# Patient Record
Sex: Female | Born: 1935 | Race: White | Hispanic: No | Marital: Married | Smoking: Former smoker
Health system: Southern US, Community
[De-identification: ages and names within clinical notes are randomized; demographics above are authoritative.]

## PROBLEM LIST (undated history)

## (undated) DIAGNOSIS — E119 Type 2 diabetes mellitus without complications: Secondary | ICD-10-CM

## (undated) DIAGNOSIS — H409 Unspecified glaucoma: Secondary | ICD-10-CM

## (undated) DIAGNOSIS — M1990 Unspecified osteoarthritis, unspecified site: Secondary | ICD-10-CM

## (undated) HISTORY — PX: THYROIDECTOMY: SHX17

---

## 1998-06-21 ENCOUNTER — Other Ambulatory Visit: Admission: RE | Admit: 1998-06-21 | Discharge: 1998-06-21 | Payer: Self-pay | Admitting: Family Medicine

## 1998-08-11 ENCOUNTER — Ambulatory Visit (HOSPITAL_COMMUNITY): Admission: RE | Admit: 1998-08-11 | Discharge: 1998-08-11 | Payer: Self-pay | Admitting: Family Medicine

## 1998-08-11 ENCOUNTER — Encounter: Payer: Self-pay | Admitting: Family Medicine

## 1999-06-20 ENCOUNTER — Encounter: Admission: RE | Admit: 1999-06-20 | Discharge: 1999-09-18 | Payer: Self-pay | Admitting: Family Medicine

## 2001-07-09 ENCOUNTER — Ambulatory Visit (HOSPITAL_COMMUNITY): Admission: RE | Admit: 2001-07-09 | Discharge: 2001-07-09 | Payer: Self-pay | Admitting: Family Medicine

## 2001-07-09 ENCOUNTER — Encounter: Payer: Self-pay | Admitting: Family Medicine

## 2001-09-11 ENCOUNTER — Other Ambulatory Visit: Admission: RE | Admit: 2001-09-11 | Discharge: 2001-09-11 | Payer: Self-pay | Admitting: Family Medicine

## 2001-09-24 ENCOUNTER — Encounter: Admission: RE | Admit: 2001-09-24 | Discharge: 2001-09-24 | Payer: Self-pay | Admitting: Family Medicine

## 2001-09-24 ENCOUNTER — Encounter: Payer: Self-pay | Admitting: Family Medicine

## 2004-01-01 ENCOUNTER — Ambulatory Visit (HOSPITAL_COMMUNITY): Admission: RE | Admit: 2004-01-01 | Discharge: 2004-01-01 | Payer: Self-pay | Admitting: Family Medicine

## 2004-06-14 ENCOUNTER — Other Ambulatory Visit: Admission: RE | Admit: 2004-06-14 | Discharge: 2004-06-14 | Payer: Self-pay | Admitting: Family Medicine

## 2007-01-14 ENCOUNTER — Other Ambulatory Visit: Admission: RE | Admit: 2007-01-14 | Discharge: 2007-01-14 | Payer: Self-pay | Admitting: Family Medicine

## 2008-01-27 ENCOUNTER — Encounter: Admission: RE | Admit: 2008-01-27 | Discharge: 2008-01-27 | Payer: Self-pay | Admitting: Family Medicine

## 2009-03-25 ENCOUNTER — Encounter: Admission: RE | Admit: 2009-03-25 | Discharge: 2009-05-26 | Payer: Self-pay | Admitting: Family Medicine

## 2010-08-21 ENCOUNTER — Encounter: Payer: Self-pay | Admitting: Family Medicine

## 2012-08-30 ENCOUNTER — Observation Stay (HOSPITAL_COMMUNITY)
Admission: EM | Admit: 2012-08-30 | Discharge: 2012-09-03 | Disposition: A | Discharge status: Skilled Nursing Facility | Payer: Medicare Other | Attending: Internal Medicine | Admitting: Internal Medicine

## 2012-08-30 ENCOUNTER — Emergency Department (HOSPITAL_COMMUNITY): Payer: Medicare Other

## 2012-08-30 ENCOUNTER — Encounter (HOSPITAL_COMMUNITY): Payer: Self-pay | Admitting: Emergency Medicine

## 2012-08-30 DIAGNOSIS — Y92009 Unspecified place in unspecified non-institutional (private) residence as the place of occurrence of the external cause: Secondary | ICD-10-CM | POA: Insufficient documentation

## 2012-08-30 DIAGNOSIS — D72829 Elevated white blood cell count, unspecified: Secondary | ICD-10-CM | POA: Insufficient documentation

## 2012-08-30 DIAGNOSIS — N390 Urinary tract infection, site not specified: Secondary | ICD-10-CM | POA: Insufficient documentation

## 2012-08-30 DIAGNOSIS — E119 Type 2 diabetes mellitus without complications: Secondary | ICD-10-CM | POA: Diagnosis present

## 2012-08-30 DIAGNOSIS — S3282XA Multiple fractures of pelvis without disruption of pelvic ring, initial encounter for closed fracture: Principal | ICD-10-CM | POA: Insufficient documentation

## 2012-08-30 DIAGNOSIS — S329XXA Fracture of unspecified parts of lumbosacral spine and pelvis, initial encounter for closed fracture: Secondary | ICD-10-CM | POA: Diagnosis present

## 2012-08-30 DIAGNOSIS — S7000XA Contusion of unspecified hip, initial encounter: Secondary | ICD-10-CM

## 2012-08-30 DIAGNOSIS — W010XXA Fall on same level from slipping, tripping and stumbling without subsequent striking against object, initial encounter: Secondary | ICD-10-CM | POA: Insufficient documentation

## 2012-08-30 DIAGNOSIS — I10 Essential (primary) hypertension: Secondary | ICD-10-CM | POA: Insufficient documentation

## 2012-08-30 HISTORY — DX: Unspecified glaucoma: H40.9

## 2012-08-30 HISTORY — DX: Type 2 diabetes mellitus without complications: E11.9

## 2012-08-30 HISTORY — DX: Unspecified osteoarthritis, unspecified site: M19.90

## 2012-08-30 MED ORDER — OXYCODONE-ACETAMINOPHEN 5-325 MG PO TABS: 1.0000 | ORAL_TABLET | Freq: Four times a day (QID) | ORAL | Status: DC | PRN

## 2012-08-30 MED ORDER — OXYCODONE-ACETAMINOPHEN 5-325 MG PO TABS
1.0000 | ORAL_TABLET | Freq: Once | ORAL | Status: AC
  Administered 2012-08-30: 1 via ORAL
  Filled 2012-08-30: qty 1

## 2012-08-30 NOTE — ED Notes (Signed)
Pt presents to ED via [facility] for evaluation of a fall - pt from standing prior to arrival - complaining of right posterior hip pain, right foot rotated, denies any other complaints.  EMS administered 150 mcg Fentanyl - pt remains in pain 10/10.

## 2012-08-30 NOTE — ED Provider Notes (Signed)
History     CSN: [id number]  Arrival date & time 08/30/12  2009   First MD Initiated Contact with Patient 08/30/12 2048      Chief Complaint  Patient presents with  . Fall    (Consider location/radiation/quality/duration/timing/severity/associated sxs/prior treatment) Patient is a 79 y.o. female presenting with fall. The history is provided by the patient (pt slipped and fell and hurt her right hip). No language interpreter was used.  Fall The accident occurred 3 to 5 hours ago. The fall occurred while walking. She fell from a height of 1 to 2 ft. She landed on a hard floor. Point of impact: right hip. Pain location: right hip. The pain is at a severity of 4/10. She was not ambulatory at the scene. Pertinent negatives include no abdominal pain, no hematuria and no headaches.    Past Medical History  Diagnosis Date  . Arthritis   . Diabetes mellitus without complication   . Glaucoma (increased eye pressure)     Past Surgical History  Procedure Date  . Thyroidectomy     No family history on file.  History  Substance Use Topics  . Smoking status: Never Smoker   . Smokeless tobacco: Not on file  . Alcohol Use: No    OB History    Grav Para Term Preterm Abortions TAB SAB Ect Mult Living                  Review of Systems  Constitutional: Negative for fatigue.  HENT: Negative for congestion, sinus pressure and ear discharge.   Eyes: Negative for discharge.  Respiratory: Negative for cough.   Cardiovascular: Negative for chest pain.  Gastrointestinal: Negative for abdominal pain and diarrhea.  Genitourinary: Negative for frequency and hematuria.  Musculoskeletal: Negative for back pain.       Right hip pain  Skin: Negative for rash.  Neurological: Negative for seizures and headaches.  Hematological: Negative.   Psychiatric/Behavioral: Negative for hallucinations.    Allergies  Aspirin; Codeine; Demerol; and Penicillins  Home Medications   Current  Outpatient Rx  Name  Route  Sig  Dispense  Refill  . GLIPIZIDE ER 10 MG PO TB24   Oral   Take 10 mg by mouth daily.         Marland Kitchen LEVOTHYROXINE SODIUM 50 MCG PO TABS   Oral   Take 50 mcg by mouth daily.         Marland Kitchen LISINOPRIL 20 MG PO TABS   Oral   Take 20 mg by mouth daily.         Marland Kitchen METFORMIN HCL 500 MG PO TABS   Oral   Take 1,000 mg by mouth daily with supper.         . OXYCODONE-ACETAMINOPHEN 5-325 MG PO TABS   Oral   Take 1 tablet by mouth every 6 (six) hours as needed for pain.   20 tablet   0     BP 163/75  Pulse 87  Temp 98.9 F (37.2 C) (Oral)  Resp 17  SpO2 99%  Physical Exam  Constitutional: She is oriented to person, place, and time. She appears well-developed.  HENT:  Head: Normocephalic and atraumatic.  Eyes: Conjunctivae normal and EOM are normal. No scleral icterus.  Neck: Neck supple. No thyromegaly present.  Cardiovascular: Normal rate and regular rhythm.  Exam reveals no gallop and no friction rub.   No murmur heard. Pulmonary/Chest: No stridor. She has no wheezes. She has no rales. She exhibits  no tenderness.  Abdominal: She exhibits no distension. There is no tenderness. There is no rebound.  Musculoskeletal: She exhibits no edema.       Tender right hip  Lymphadenopathy:    She has no cervical adenopathy.  Neurological: She is oriented to person, place, and time. Coordination normal.  Skin: No rash noted. No erythema.  Psychiatric: She has a normal mood and affect. Her behavior is normal.    ED Course  Procedures (including critical care time)  Labs Reviewed - No data to display Dg Hip Complete Right  08/30/2012  *RADIOLOGY REPORT*  Clinical Data: Fall. Right hip pain.  RIGHT HIP - COMPLETE 2+ VIEW  Comparison: None.  Findings: No evidence of hip fracture or dislocation.  Generalized osteopenia noted.  Old fracture deformities are seen involving the right ileum and pubis.  IMPRESSION:  1.  No acute findings. 2.  Osteopenia and old right  pelvic fracture deformities.   Original Report Authenticated By: Myles Rosenthal, M.D.      1. Contusion, hip       MDM          Benny Lennert, MD 08/30/12 [phone]

## 2012-08-30 NOTE — ED Notes (Signed)
Patient transported to X-ray 

## 2012-08-31 ENCOUNTER — Encounter (HOSPITAL_COMMUNITY): Payer: Self-pay | Admitting: Internal Medicine

## 2012-08-31 ENCOUNTER — Emergency Department (HOSPITAL_COMMUNITY): Payer: Medicare Other

## 2012-08-31 DIAGNOSIS — I10 Essential (primary) hypertension: Secondary | ICD-10-CM | POA: Diagnosis present

## 2012-08-31 DIAGNOSIS — E119 Type 2 diabetes mellitus without complications: Secondary | ICD-10-CM

## 2012-08-31 DIAGNOSIS — S329XXA Fracture of unspecified parts of lumbosacral spine and pelvis, initial encounter for closed fracture: Secondary | ICD-10-CM | POA: Diagnosis present

## 2012-08-31 DIAGNOSIS — N390 Urinary tract infection, site not specified: Secondary | ICD-10-CM | POA: Diagnosis present

## 2012-08-31 DIAGNOSIS — D72829 Elevated white blood cell count, unspecified: Secondary | ICD-10-CM | POA: Diagnosis present

## 2012-08-31 LAB — COMPREHENSIVE METABOLIC PANEL
ALT: 17 U/L (ref 0–35)
AST: 21 U/L (ref 0–37)
CO2: 27 mEq/L (ref 19–32)
Calcium: 8.9 mg/dL (ref 8.4–10.5)
Chloride: 100 mEq/L (ref 96–112)
Creatinine, Ser: 0.58 mg/dL (ref 0.50–1.10)
GFR calc Af Amer: >90 mL/min (ref >90)
GFR calc non Af Amer: 79 mL/min — ABNORMAL LOW (ref >90)
Glucose, Bld: 211 mg/dL — ABNORMAL HIGH (ref 70–99)
Total Bilirubin: 0.6 mg/dL (ref 0.3–1.2)

## 2012-08-31 LAB — CBC
HCT: 41.1 % (ref 36.0–46.0)
Hemoglobin: 13.4 g/dL (ref 12.0–15.0)
MCH: 31.6 pg (ref 26.0–34.0)
MCHC: 35.3 g/dL (ref 30.0–36.0)
MCHC: 34.7 g/dL (ref 30.0–36.0)
MCV: 89.3 fL (ref 78.0–100.0)
Platelets: 262 10*3/uL (ref 150–400)
Platelets: 268 10*3/uL (ref 150–400)
RBC: 4.24 MIL/uL (ref 3.87–5.11)
RDW: 12.5 % (ref 11.5–15.5)

## 2012-08-31 LAB — BASIC METABOLIC PANEL
BUN: 15 mg/dL (ref 6–23)
Creatinine, Ser: 0.48 mg/dL — ABNORMAL LOW (ref 0.50–1.10)
GFR calc Af Amer: >90 mL/min (ref >90)
GFR calc non Af Amer: 84 mL/min — ABNORMAL LOW (ref >90)
Potassium: 3.8 mEq/L (ref 3.5–5.1)

## 2012-08-31 LAB — URINALYSIS, MICROSCOPIC ONLY
Ketones, ur: 40 mg/dL — AB
Nitrite: POSITIVE — AB
Specific Gravity, Urine: 1.022 (ref 1.005–1.030)
pH: 5.5 (ref 5.0–8.0)

## 2012-08-31 LAB — HEMOGLOBIN A1C
Hgb A1c MFr Bld: 7.8 % — ABNORMAL HIGH (ref <5.7)
Mean Plasma Glucose: 177 mg/dL — ABNORMAL HIGH (ref <117)

## 2012-08-31 LAB — GLUCOSE, CAPILLARY: Glucose-Capillary: 263 mg/dL — ABNORMAL HIGH (ref 70–99)

## 2012-08-31 LAB — TSH: TSH: 0.17 u[IU]/mL — ABNORMAL LOW (ref 0.350–4.500)

## 2012-08-31 MED ORDER — ONDANSETRON HCL 4 MG/2ML IJ SOLN
4.0000 mg | Freq: Once | INTRAMUSCULAR | Status: AC
  Administered 2012-08-31: 4 mg via INTRAVENOUS
  Filled 2012-08-31: qty 2

## 2012-08-31 MED ORDER — ONDANSETRON HCL 4 MG/2ML IJ SOLN: 4.0000 mg | Freq: Four times a day (QID) | INTRAMUSCULAR | Status: DC | PRN

## 2012-08-31 MED ORDER — ACETAMINOPHEN 650 MG RE SUPP: 650.0000 mg | Freq: Four times a day (QID) | RECTAL | Status: DC | PRN

## 2012-08-31 MED ORDER — INSULIN ASPART 100 UNIT/ML ~~LOC~~ SOLN
4.0000 [IU] | Freq: Three times a day (TID) | SUBCUTANEOUS | Status: DC
  Administered 2012-09-01: 4 [IU] via SUBCUTANEOUS

## 2012-08-31 MED ORDER — DOCUSATE SODIUM 100 MG PO CAPS
100.0000 mg | ORAL_CAPSULE | Freq: Two times a day (BID) | ORAL | Status: DC
  Administered 2012-08-31 – 2012-09-03 (×7): 100 mg via ORAL
  Filled 2012-08-31 (×8): qty 1

## 2012-08-31 MED ORDER — MORPHINE SULFATE 2 MG/ML IJ SOLN: 2.0000 mg | INTRAMUSCULAR | Status: DC | PRN

## 2012-08-31 MED ORDER — LISINOPRIL 20 MG PO TABS
20.0000 mg | ORAL_TABLET | Freq: Every day | ORAL | Status: DC
  Administered 2012-08-31 – 2012-09-03 (×4): 20 mg via ORAL
  Filled 2012-08-31 (×4): qty 1

## 2012-08-31 MED ORDER — ENOXAPARIN SODIUM 40 MG/0.4ML ~~LOC~~ SOLN
40.0000 mg | SUBCUTANEOUS | Status: DC
  Administered 2012-08-31 – 2012-09-03 (×4): 40 mg via SUBCUTANEOUS
  Filled 2012-08-31 (×4): qty 0.4

## 2012-08-31 MED ORDER — HYDROCODONE-ACETAMINOPHEN 5-325 MG PO TABS
1.0000 | ORAL_TABLET | ORAL | Status: DC | PRN
  Administered 2012-09-03: 1 via ORAL
  Filled 2012-08-31: qty 1

## 2012-08-31 MED ORDER — SODIUM CHLORIDE 0.9 % IV SOLN
INTRAVENOUS | Status: DC
  Administered 2012-08-31: 05:00:00 via INTRAVENOUS

## 2012-08-31 MED ORDER — TRAMADOL HCL 50 MG PO TABS
25.0000 mg | ORAL_TABLET | Freq: Three times a day (TID) | ORAL | Status: DC
  Administered 2012-08-31 – 2012-09-03 (×9): 25 mg via ORAL
  Filled 2012-08-31 (×9): qty 1

## 2012-08-31 MED ORDER — INSULIN ASPART 100 UNIT/ML ~~LOC~~ SOLN
0.0000 [IU] | Freq: Three times a day (TID) | SUBCUTANEOUS | Status: DC
  Administered 2012-09-01: 2 [IU] via SUBCUTANEOUS
  Administered 2012-09-01: 5 [IU] via SUBCUTANEOUS
  Administered 2012-09-02 (×2): 2 [IU] via SUBCUTANEOUS
  Administered 2012-09-02: 8 [IU] via SUBCUTANEOUS
  Administered 2012-09-03 (×2): 2 [IU] via SUBCUTANEOUS
  Administered 2012-09-03: 3 [IU] via SUBCUTANEOUS

## 2012-08-31 MED ORDER — LEVOFLOXACIN 500 MG PO TABS
500.0000 mg | ORAL_TABLET | Freq: Every day | ORAL | Status: DC
  Administered 2012-08-31 – 2012-09-03 (×4): 500 mg via ORAL
  Filled 2012-08-31 (×4): qty 1

## 2012-08-31 MED ORDER — INSULIN ASPART 100 UNIT/ML ~~LOC~~ SOLN: 0.0000 [IU] | Freq: Every day | SUBCUTANEOUS | Status: DC

## 2012-08-31 MED ORDER — ACETAMINOPHEN 325 MG PO TABS: 650.0000 mg | ORAL_TABLET | Freq: Four times a day (QID) | ORAL | Status: DC | PRN

## 2012-08-31 MED ORDER — GLIPIZIDE ER 10 MG PO TB24
10.0000 mg | ORAL_TABLET | Freq: Every day | ORAL | Status: DC
  Administered 2012-08-31 – 2012-09-03 (×4): 10 mg via ORAL
  Filled 2012-08-31 (×4): qty 1

## 2012-08-31 MED ORDER — PANTOPRAZOLE SODIUM 40 MG PO TBEC
40.0000 mg | DELAYED_RELEASE_TABLET | Freq: Every day | ORAL | Status: DC
  Administered 2012-08-31 – 2012-09-02 (×3): 40 mg via ORAL
  Filled 2012-08-31 (×3): qty 1

## 2012-08-31 MED ORDER — HYDROCODONE-ACETAMINOPHEN 5-325 MG PO TABS: 1.0000 | ORAL_TABLET | ORAL | Status: DC | PRN

## 2012-08-31 MED ORDER — ONDANSETRON HCL 4 MG PO TABS: 4.0000 mg | ORAL_TABLET | Freq: Four times a day (QID) | ORAL | Status: DC | PRN

## 2012-08-31 MED ORDER — MORPHINE SULFATE 2 MG/ML IJ SOLN
2.0000 mg | Freq: Once | INTRAMUSCULAR | Status: AC
  Administered 2012-08-31: 2 mg via INTRAVENOUS
  Filled 2012-08-31: qty 1

## 2012-08-31 MED ORDER — SODIUM CHLORIDE 0.9 % IJ SOLN
3.0000 mL | Freq: Two times a day (BID) | INTRAMUSCULAR | Status: DC
  Administered 2012-08-31 – 2012-09-01 (×2): 3 mL via INTRAVENOUS

## 2012-08-31 MED ORDER — INSULIN ASPART 100 UNIT/ML ~~LOC~~ SOLN
4.0000 [IU] | Freq: Once | SUBCUTANEOUS | Status: AC
  Administered 2012-08-31: 4 [IU] via SUBCUTANEOUS

## 2012-08-31 MED ORDER — HYDRALAZINE HCL 20 MG/ML IJ SOLN
10.0000 mg | Freq: Once | INTRAMUSCULAR | Status: DC
  Filled 2012-08-31: qty 0.5

## 2012-08-31 MED ORDER — LEVOTHYROXINE SODIUM 50 MCG PO TABS
50.0000 ug | ORAL_TABLET | Freq: Every day | ORAL | Status: DC
  Administered 2012-08-31 – 2012-09-03 (×4): 50 ug via ORAL
  Filled 2012-08-31 (×4): qty 1

## 2012-08-31 NOTE — Consult Note (Signed)
Reason for Consult:right hip pain Referring Physician: Oti  HPI: Carla Hartman is an 79 y.o. female who fell in her home yesterday.She was admitted and xrays along with CT scan of Right hip were ordered and we were consulted to provide recommendations on management. On eval pt seems unclear surrounding history and orientation is questionable. She does know president and can give some history as to family locally  Past Medical History  Diagnosis Date  . Arthritis   . Diabetes mellitus without complication   . Glaucoma (increased eye pressure)     Past Surgical History  Procedure Date  . Thyroidectomy     Family History  Problem Relation Age of Onset  . Heart disease Mother   . Alcohol abuse Father     Social History:  reports that she has quit smoking. She does not have any smokeless tobacco history on file. She reports that she does not drink alcohol or use illicit drugs.  Allergies:  Allergies  Allergen Reactions  . Aspirin Swelling    Unknown  . Codeine     Unknown  . Demerol (Meperidine)     Unknown  . Penicillins     Unknown    Medications: I have reviewed the patient's current medications.  Results for orders placed during the hospital encounter of 08/30/12 (from the past 48 hour(s))  CBC     Status: Abnormal   Collection Time   08/31/12  2:03 AM      Component Value Range Comment   WBC 19.4 (*) 4.0 - 10.5 K/uL    RBC 4.60  3.87 - 5.11 MIL/uL    Hemoglobin 14.5  12.0 - 15.0 g/dL    HCT 16.1  09.6 - 04.5 %    MCV 89.3  78.0 - 100.0 fL    MCH 31.5  26.0 - 34.0 pg    MCHC 35.3  30.0 - 36.0 g/dL    RDW 40.9  81.1 - 91.4 %    Platelets 262  150 - 400 K/uL   BASIC METABOLIC PANEL     Status: Abnormal   Collection Time   08/31/12  2:03 AM      Component Value Range Comment   Sodium 131 (*) 135 - 145 mEq/L    Potassium 3.8  3.5 - 5.1 mEq/L    Chloride 96  96 - 112 mEq/L    CO2 23  19 - 32 mEq/L    Glucose, Bld 259 (*) 70 - 99 mg/dL    BUN 15  6 - 23 mg/dL     Creatinine, Ser 7.82 (*) 0.50 - 1.10 mg/dL    Calcium 8.9  8.4 - 95.6 mg/dL    GFR calc non Af Amer 84 (*) >90 mL/min    GFR calc Af Amer >90  >90 mL/min   GLUCOSE, CAPILLARY     Status: Abnormal   Collection Time   08/31/12  4:15 AM      Component Value Range Comment   Glucose-Capillary 263 (*) 70 - 99 mg/dL    Comment 1 Notify RN     URINALYSIS, MICROSCOPIC ONLY     Status: Abnormal   Collection Time   08/31/12  5:32 AM      Component Value Range Comment   Color, Urine YELLOW  YELLOW    APPearance TURBID (*) CLEAR    Specific Gravity, Urine 1.022  1.005 - 1.030    pH 5.5  5.0 - 8.0    Glucose, UA 500 (*)  NEGATIVE mg/dL    Hgb urine dipstick LARGE (*) NEGATIVE    Bilirubin Urine NEGATIVE  NEGATIVE    Ketones, ur 40 (*) NEGATIVE mg/dL    Protein, ur 161 (*) NEGATIVE mg/dL    Urobilinogen, UA 0.2  0.0 - 1.0 mg/dL    Nitrite POSITIVE (*) NEGATIVE    Leukocytes, UA LARGE (*) NEGATIVE    WBC, UA TOO NUMEROUS TO COUNT  <3 WBC/hpf    RBC / HPF 11-20  <3 RBC/hpf    Bacteria, UA FEW (*) RARE   MAGNESIUM     Status: Normal   Collection Time   08/31/12  6:34 AM      Component Value Range Comment   Magnesium 2.1  1.5 - 2.5 mg/dL   PHOSPHORUS     Status: Normal   Collection Time   08/31/12  6:34 AM      Component Value Range Comment   Phosphorus 3.8  2.3 - 4.6 mg/dL   COMPREHENSIVE METABOLIC PANEL     Status: Abnormal   Collection Time   08/31/12  6:34 AM      Component Value Range Comment   Sodium 137  135 - 145 mEq/L    Potassium 3.7  3.5 - 5.1 mEq/L    Chloride 100  96 - 112 mEq/L    CO2 27  19 - 32 mEq/L    Glucose, Bld 211 (*) 70 - 99 mg/dL    BUN 17  6 - 23 mg/dL    Creatinine, Ser 0.96  0.50 - 1.10 mg/dL    Calcium 8.9  8.4 - 04.5 mg/dL    Total Protein 6.2  6.0 - 8.3 g/dL    Albumin 3.1 (*) 3.5 - 5.2 g/dL    AST 21  0 - 37 U/L    ALT 17  0 - 35 U/L    Alkaline Phosphatase 66  39 - 117 U/L    Total Bilirubin 0.6  0.3 - 1.2 mg/dL    GFR calc non Af Amer 79 (*) >90 mL/min     GFR calc Af Amer >90  >90 mL/min   CBC     Status: Abnormal   Collection Time   08/31/12  6:34 AM      Component Value Range Comment   WBC 17.7 (*) 4.0 - 10.5 K/uL    RBC 4.24  3.87 - 5.11 MIL/uL    Hemoglobin 13.4  12.0 - 15.0 g/dL    HCT 40.9  81.1 - 91.4 %    MCV 91.0  78.0 - 100.0 fL    MCH 31.6  26.0 - 34.0 pg    MCHC 34.7  30.0 - 36.0 g/dL    RDW 78.2  95.6 - 21.3 %    Platelets 268  150 - 400 K/uL     Dg Hip Complete Right  08/30/2012  *RADIOLOGY REPORT*  Clinical Data: Fall. Right hip pain.  RIGHT HIP - COMPLETE 2+ VIEW  Comparison: None.  Findings: No evidence of hip fracture or dislocation.  Generalized osteopenia noted.  Old fracture deformities are seen involving the right ileum and pubis.  IMPRESSION:  1.  No acute findings. 2.  Osteopenia and old right pelvic fracture deformities.   Original Report Authenticated By: Myles Rosenthal, M.D.    Ct Hip Right Wo Contrast  08/31/2012  *RADIOLOGY REPORT*  Clinical Data: Right hip pain after fall.  CT OF THE RIGHT HIP WITHOUT CONTRAST  Technique:  Multidetector CT imaging was performed  according to the standard protocol. Multiplanar CT image reconstructions were also generated.  Comparison: Right hip 08/30/2012  Findings: The there is an acute comminuted fracture of the right hemi pelvis involving the body of the iliac bone extending to the iliac crests superiorly and to the sacroiliac joint medially. There is impaction and displacement of the fracture fragments. There is associated hematoma involving the gluteal and iliopsoas muscles. Nondisplaced fractures of the distal aspect of the superior and inferior pubic rami on the right.  The acetabulum, right hip, and symphysis pubis appear intact. Degenerative changes in the right hip.  IMPRESSION: Multiple comminuted and displaced fractures of the body of the right iliac bone extending to the iliac crest and sacroiliac joint. Nondisplaced fractures of the right superior and inferior pubic rami.    Original Report Authenticated By: Burman Nieves, M.D.       Physical Exam: Pt somewhat disoriented but answers questions and seems to answer appropriately. Pt with pain on palpation to her right hip girdle and iliac wing. She is NVI distally Vitals Temp:  [98.1 F (36.7 C)-98.9 F (37.2 C)] 98.1 F (36.7 C) (02/01 0352) Pulse Rate:  [79-87] 82  (02/01 0451) Resp:  [17-18] 18  (02/01 0352) BP: (119-181)/(36-75) 123/39 mmHg (02/01 0451) SpO2:  [96 %-99 %] 96 % (02/01 0352) Weight:  [68.04 kg (150 lb)] 68.04 kg (150 lb) (02/01 0900) Body mass index is 22.15 kg/(m^2).  Assessment/Plan: Impression: Right Pelvic iliac wing fracture Treatment: Her treatment should be conservative and she can be WBAT to her Right LE but it is likely to be quite painful for several weeks. Will order PT and recommend prn analgesics. She can follow up with [place] in 6 weeks for repeat xrays. Patient may need higher level of assistance than assisted living based on her abilities  [facility] for Dr. Francena Hanly 08/31/2012, 9:41 AM

## 2012-08-31 NOTE — ED Notes (Addendum)
Pt returned from CT °

## 2012-08-31 NOTE — H&P (Signed)
PCP: Coralyn Mark   Chief Complaint:  Fall   HPI: Carla Hartman is a 79 y.o. female   has a past medical history of Arthritis; Diabetes mellitus without complication; and Glaucoma (increased eye pressure).   Presented with  She was walking before diner and tripped on the carpet. She was using the walker at the time. Denies hitting her head.  No hx of syncope. No recent chest pain no fever or chills. CT scan of the hip showed numerous pelvic fractures. The Orthopedics have been notified and will see her in AM. [facility] to admit for pain management and diabetes treatment.   Review of Systems:    Pertinent positives include: none  Constitutional:  No weight loss, night sweats, Fevers, chills, fatigue, weight loss  HEENT:  No headaches, Difficulty swallowing,Tooth/dental problems,Sore throat,  No sneezing, itching, ear ache, nasal congestion, post nasal drip,  Cardio-vascular:  No chest pain, Orthopnea, PND, anasarca, dizziness, palpitations.no Bilateral lower extremity swelling  GI:  No heartburn, indigestion, abdominal pain, nausea, vomiting, diarrhea, change in bowel habits, loss of appetite, melena, blood in stool, hematemesis Resp:  no shortness of breath at rest. No dyspnea on exertion, No excess mucus, no productive cough, No non-productive cough, No coughing up of blood.No change in color of mucus.No wheezing. Skin:  no rash or lesions. No jaundice GU:  no dysuria, change in color of urine, no urgency or frequency. No straining to urinate.  No flank pain.  Musculoskeletal:  No joint pain or no joint swelling. No decreased range of motion. No back pain.  Psych:  No change in mood or affect. No depression or anxiety. No memory loss.  Neuro: no localizing neurological complaints, no tingling, no weakness, no double vision, no gait abnormality, no slurred speech, no confusion  Otherwise ROS are negative except for above, 10 systems were reviewed  Past Medical  History: Past Medical History  Diagnosis Date  . Arthritis   . Diabetes mellitus without complication   . Glaucoma (increased eye pressure)    Past Surgical History  Procedure Date  . Thyroidectomy      Medications: Prior to Admission medications   Medication Sig Start Date End Date Taking? Authorizing Provider  glipiZIDE (GLUCOTROL XL) 10 MG 24 hr tablet Take 10 mg by mouth daily.   Yes Historical Provider, MD  levothyroxine (SYNTHROID, LEVOTHROID) 50 MCG tablet Take 50 mcg by mouth daily.   Yes Historical Provider, MD  lisinopril (PRINIVIL,ZESTRIL) 20 MG tablet Take 20 mg by mouth daily.   Yes Historical Provider, MD  metFORMIN (GLUCOPHAGE) 500 MG tablet Take 1,000 mg by mouth daily with supper.   Yes Historical Provider, MD  oxyCODONE-acetaminophen (PERCOCET) 5-325 MG per tablet Take 1 tablet by mouth every 6 (six) hours as needed for pain. 08/30/12   Benny Lennert, MD    Allergies:   Allergies  Allergen Reactions  . Aspirin Swelling    Unknown  . Codeine     Unknown  . Demerol (Meperidine)     Unknown  . Penicillins     Unknown    Social History:  Ambulatory  Walker  Lives at [facility] [place]   reports that she has quit smoking. She does not have any smokeless tobacco history on file. She reports that she does not drink alcohol or use illicit drugs.   Family History: family history includes Alcohol abuse in her father and Heart disease in her mother.    Physical Exam: Patient Vitals for the past  24 hrs:  BP Temp Temp src Pulse Resp SpO2  08/31/12 0052 158/68 mmHg 98.8 F (37.1 C) Oral 86  18  99 %  08/30/12 2033 163/75 mmHg 98.9 F (37.2 C) Oral 87  17  99 %    1. General:  in No Acute distress 2. Psychological: Alert and  Oriented 3. Head/ENT:     Dry Mucous Membranes                          Head Non traumatic, neck supple                          Normal   Dentition 4. SKIN: normal   Skin turgor,  Skin clean Dry and intact no rash 5. Heart:  Regular rate and rhythm no Murmur, Rub or gallop 6. Lungs: Clear to auscultation bilaterally, no wheezes or crackles   7. Abdomen: Soft, non-tender, Non distended except for distended bladder.  8. Lower extremities: no clubbing, cyanosis, or edema 9. Neurologically Grossly intact, moving all 4 extremities equally 10. MSK: Normal range of motion, limited in lower extremities due to fracture.  body mass index is unknown because there is no height or weight on file.   Labs on Admission:   [facility] 08/31/12 0203  NA 131*  K 3.8  CL 96  CO2 23  GLUCOSE 259*  BUN 15  CREATININE 0.48*  CALCIUM 8.9  MG --  PHOS --   No results found for this basename: AST:2,ALT:2,ALKPHOS:2,BILITOT:2,PROT:2,ALBUMIN:2 in the last 72 hours No results found for this basename: LIPASE:2,AMYLASE:2 in the last 72 hours  Basename 08/31/12 0203  WBC 19.4*  NEUTROABS --  HGB 14.5  HCT 41.1  MCV 89.3  PLT 262   No results found for this basename: CKTOTAL:3,CKMB:3,CKMBINDEX:3,TROPONINI:3 in the last 72 hours No results found for this basename: TSH,T4TOTAL,FREET3,T3FREE,THYROIDAB in the last 72 hours No results found for this basename: VITAMINB12:2,FOLATE:2,FERRITIN:2,TIBC:2,IRON:2,RETICCTPCT:2 in the last 72 hours No results found for this basename: HGBA1C    CrCl is unknown because there is no height on file for the current visit. ABG No results found for this basename: phart, pco2, po2, hco3, tco2, acidbasedef, o2sat     No results found for this basename: DDIMER    Cultures: No results found for this basename: sdes, specrequest, cult, reptstatus       Radiological Exams on Admission: Dg Hip Complete Right  08/30/2012  *RADIOLOGY REPORT*  Clinical Data: Fall. Right hip pain.  RIGHT HIP - COMPLETE 2+ VIEW  Comparison: None.  Findings: No evidence of hip fracture or dislocation.  Generalized osteopenia noted.  Old fracture deformities are seen involving the right ileum and pubis.  IMPRESSION:  1.  No  acute findings. 2.  Osteopenia and old right pelvic fracture deformities.   Original Report Authenticated By: Myles Rosenthal, M.D.    Ct Hip Right Wo Contrast  08/31/2012  *RADIOLOGY REPORT*  Clinical Data: Right hip pain after fall.  CT OF THE RIGHT HIP WITHOUT CONTRAST  Technique:  Multidetector CT imaging was performed according to the standard protocol. Multiplanar CT image reconstructions were also generated.  Comparison: Right hip 08/30/2012  Findings: The there is an acute comminuted fracture of the right hemi pelvis involving the body of the iliac bone extending to the iliac crests superiorly and to the sacroiliac joint medially. There is impaction and displacement of the fracture fragments. There is associated hematoma involving the  gluteal and iliopsoas muscles. Nondisplaced fractures of the distal aspect of the superior and inferior pubic rami on the right.  The acetabulum, right hip, and symphysis pubis appear intact. Degenerative changes in the right hip.  IMPRESSION: Multiple comminuted and displaced fractures of the body of the right iliac bone extending to the iliac crest and sacroiliac joint. Nondisplaced fractures of the right superior and inferior pubic rami.   Original Report Authenticated By: Burman Nieves, M.D.     Chart has been reviewed  Assessment/Plan  79 yo F here after mechanical fall with numerous pelvic fractures admitted for pain management  Present on Admission:  . Pelvic fracture - orthopedics is aware and will see patient in AM, will admit to med-surge floor and manage pain . Diabetes mellitus - ssi, hold metformin . Hypertension - continue home medications . Leukocytosis - likely stress related but will obtain UA.    Prophylaxis:   Lovenox, Protonix  CODE STATUS:DNR/DNI  Other plan as per orders.  I have spent a total of 55 min on this admission  Charlean Carneal 08/31/2012, 3:07 AM

## 2012-08-31 NOTE — Progress Notes (Signed)
[facility] PROGRESS NOTE  Carla Hartman ZOX:[id number] DOB: [birth year] DOA: 08/30/2012 PCP: No primary provider on file.  Assessment/Plan: Active Problems:  Diabetes mellitus  Pelvic fracture  Hypertension  Leukocytosis    1. Pelvic fracture: Patient presented following a mechanical fall, and associated right hip pain. Although X-Ray was devoid of acute findings, CT scan revealed multiple comminuted and displaced fractures of the body of the  right iliac bone extending to the iliac crest and sacroiliac joint, as well as nondisplaced fractures of the right superior and inferior pubic rami. managing with analgesics. Orthopedics consultation has been requested, and recommendations are awaited. Management is likely to be conservative.  2. Diabetes mellitus: This is sub-optimally controlled, based on random blood glucose of 259 at presentation. Managing with diet, SSI, and Glipizide. Metformin is currently on hold. 3. Hypertension: Controlled this AM. Continued on pre-admission anti-hypertensives.  4. Leukocytosis: Wcc was 19.4 on admission. This is likely due to margination, but earty UTI may be contributory. Following CBC.  5. UTI: Urinalysis revealed a positive sediment with pyuria and microhematuria. Will commence empiric Levaquin and await cultures.    Code Status: DNR/DNI Family Communication:  Disposition Plan: To be determined.   Brief narrative: 79 y.o. Female with history of Arthritis; Diabetes mellitus, Glaucoma and previous thyroidectomy, presenting following a fall. She was walking before dinner and tripped on the carpet, while using her walker. Denies hitting her head, no hx of syncope, no recent chest pain, fever or chills. CT scan of the hip showed numerous pelvic fractures. The Orthopedics have been notified and will see her in AM. Admitted for pain management and diabetes treatment.    Consultants:  Orthopedic surgeon.   Procedures:  X-Ray right hip.   CT  scan of right hip.   Antibiotics:  N/A.   HPI/Subjective: No new issues.   Objective: Vital signs in last 24 hours: Temp:  [98.1 F (36.7 C)-98.9 F (37.2 C)] 98.1 F (36.7 C) (02/01 0352) Pulse Rate:  [79-87] 82  (02/01 0451) Resp:  [17-18] 18  (02/01 0352) BP: (119-181)/(36-75) 123/39 mmHg (02/01 0451) SpO2:  [96 %-99 %] 96 % (02/01 0352) Weight change:  Last BM Date:  (prior to admission)  Intake/Output from previous day: 01/31 0701 - 02/01 0700 In: 67.5 [I.V.:67.5] Out: -      Physical Exam: General: Comfortable pain-wise, alert, communicative, fully oriented, not short of breath at rest.  HEENT:  No clinical pallor, no jaundice, no conjunctival injection or discharge. Hydration is fair.  NECK:  Supple, JVP not seen, no carotid bruits, no palpable lymphadenopathy, no palpable goiter. CHEST:  Clinically clear to auscultation, no wheezes, no crackles. HEART:  Sounds 1 and 2 heard, normal, regular, no murmurs. ABDOMEN:  Moderately obese, soft, non-tender, no palpable organomegaly, no palpable masses, normal bowel sounds. GENITALIA:  Not examined. LOWER EXTREMITIES:  No pitting edema, palpable peripheral pulses. MUSCULOSKELETAL SYSTEM:  Generalized osteoarthritic changes, otherwise, normal. Has pain on right hip movement. No obvious bruising.  CENTRAL NERVOUS SYSTEM:  No focal neurologic deficit on gross examination.  Lab Results:  [facility] 08/31/12 0203  WBC 19.4*  HGB 14.5  HCT 41.1  PLT 262    Basename 08/31/12 0203  NA 131*  K 3.8  CL 96  CO2 23  GLUCOSE 259*  BUN 15  CREATININE 0.48*  CALCIUM 8.9   No results found for this or any previous visit (from the past 240 hour(s)).   Studies/Results: Dg Hip Complete Right  08/30/2012  *RADIOLOGY  REPORT*  Clinical Data: Fall. Right hip pain.  RIGHT HIP - COMPLETE 2+ VIEW  Comparison: None.  Findings: No evidence of hip fracture or dislocation.  Generalized osteopenia noted.  Old fracture deformities are  seen involving the right ileum and pubis.  IMPRESSION:  1.  No acute findings. 2.  Osteopenia and old right pelvic fracture deformities.   Original Report Authenticated By: Myles Rosenthal, M.D.    Ct Hip Right Wo Contrast  08/31/2012  *RADIOLOGY REPORT*  Clinical Data: Right hip pain after fall.  CT OF THE RIGHT HIP WITHOUT CONTRAST  Technique:  Multidetector CT imaging was performed according to the standard protocol. Multiplanar CT image reconstructions were also generated.  Comparison: Right hip 08/30/2012  Findings: The there is an acute comminuted fracture of the right hemi pelvis involving the body of the iliac bone extending to the iliac crests superiorly and to the sacroiliac joint medially. There is impaction and displacement of the fracture fragments. There is associated hematoma involving the gluteal and iliopsoas muscles. Nondisplaced fractures of the distal aspect of the superior and inferior pubic rami on the right.  The acetabulum, right hip, and symphysis pubis appear intact. Degenerative changes in the right hip.  IMPRESSION: Multiple comminuted and displaced fractures of the body of the right iliac bone extending to the iliac crest and sacroiliac joint. Nondisplaced fractures of the right superior and inferior pubic rami.   Original Report Authenticated By: Burman Nieves, M.D.     Medications: Scheduled Meds:   . docusate sodium  100 mg Oral BID  . enoxaparin (LOVENOX) injection  40 mg Subcutaneous Q24H  . glipiZIDE  10 mg Oral Daily  . hydrALAZINE  10 mg Intravenous Once  . insulin aspart  0-15 Units Subcutaneous TID WC  . insulin aspart  0-5 Units Subcutaneous QHS  . levothyroxine  50 mcg Oral Daily  . lisinopril  20 mg Oral Daily  . pantoprazole  40 mg Oral Q1200  . sodium chloride  3 mL Intravenous Q12H   Continuous Infusions:   . sodium chloride 75 mL/hr at 08/31/12 0457   PRN Meds:.acetaminophen, acetaminophen, HYDROcodone-acetaminophen, morphine injection, ondansetron  (ZOFRAN) IV, ondansetron    LOS: 1 day   Timea Breed,CHRISTOPHER  [facility] Pager [phone]. If 8PM-8AM, please contact night-coverage at [email or web address], password [facility] 08/31/2012, 7:15 AM  LOS: 1 day

## 2012-08-31 NOTE — ED Provider Notes (Signed)
Patient with mechanical fall, pelvic x-rays negative, but CT scan shows right hemipelvis fractures. Discuss case with Dr. supple, on-call for orthopedics. He recommends medicine admission and they will do consult.  He recommends weight bearing as tolerated.  No results found for this or any previous visit. Dg Hip Complete Right  08/30/2012  *RADIOLOGY REPORT*  Clinical Data: Fall. Right hip pain.  RIGHT HIP - COMPLETE 2+ VIEW  Comparison: None.  Findings: No evidence of hip fracture or dislocation.  Generalized osteopenia noted.  Old fracture deformities are seen involving the right ileum and pubis.  IMPRESSION:  1.  No acute findings. 2.  Osteopenia and old right pelvic fracture deformities.   Original Report Authenticated By: Myles Rosenthal, M.D.    Ct Hip Right Wo Contrast  08/31/2012  *RADIOLOGY REPORT*  Clinical Data: Right hip pain after fall.  CT OF THE RIGHT HIP WITHOUT CONTRAST  Technique:  Multidetector CT imaging was performed according to the standard protocol. Multiplanar CT image reconstructions were also generated.  Comparison: Right hip 08/30/2012  Findings: The there is an acute comminuted fracture of the right hemi pelvis involving the body of the iliac bone extending to the iliac crests superiorly and to the sacroiliac joint medially. There is impaction and displacement of the fracture fragments. There is associated hematoma involving the gluteal and iliopsoas muscles. Nondisplaced fractures of the distal aspect of the superior and inferior pubic rami on the right.  The acetabulum, right hip, and symphysis pubis appear intact. Degenerative changes in the right hip.  IMPRESSION: Multiple comminuted and displaced fractures of the body of the right iliac bone extending to the iliac crest and sacroiliac joint. Nondisplaced fractures of the right superior and inferior pubic rami.   Original Report Authenticated By: Burman Nieves, M.D.       Olivia Mackie, MD 08/31/12 [phone]

## 2012-09-01 DIAGNOSIS — S7000XA Contusion of unspecified hip, initial encounter: Secondary | ICD-10-CM

## 2012-09-01 LAB — URINE CULTURE
Colony Count: NO GROWTH
Culture: NO GROWTH

## 2012-09-01 LAB — CBC
HCT: 36.8 % (ref 36.0–46.0)
Hemoglobin: 12.7 g/dL (ref 12.0–15.0)
MCH: 31.1 pg (ref 26.0–34.0)
MCHC: 34.5 g/dL (ref 30.0–36.0)

## 2012-09-01 LAB — BASIC METABOLIC PANEL
BUN: 13 mg/dL (ref 6–23)
Calcium: 8.6 mg/dL (ref 8.4–10.5)
GFR calc non Af Amer: 83 mL/min — ABNORMAL LOW (ref >90)
Glucose, Bld: 167 mg/dL — ABNORMAL HIGH (ref 70–99)
Potassium: 3.6 mEq/L (ref 3.5–5.1)

## 2012-09-01 LAB — GLUCOSE, CAPILLARY: Glucose-Capillary: 113 mg/dL — ABNORMAL HIGH (ref 70–99)

## 2012-09-01 MED ORDER — INSULIN GLARGINE 100 UNIT/ML ~~LOC~~ SOLN
10.0000 [IU] | Freq: Every day | SUBCUTANEOUS | Status: DC
  Administered 2012-09-02: 5 [IU] via SUBCUTANEOUS
  Administered 2012-09-02: 10 [IU] via SUBCUTANEOUS

## 2012-09-01 NOTE — Evaluation (Signed)
Physical Therapy Evaluation Patient Details Name: Carla Hartman MRN: [id number] DOB: 1921-11-30 Today's Date: 09/01/2012 Time: 2841-3244 PT Time Calculation (min): 19 min  PT Assessment / Plan / Recommendation Clinical Impression  pt presents with R Pelvic fx.  pt with cognitive deficits and very painful in R hip.  pt unaware when her last pain meds were given and when she is sitting still has minimal pain, and question pt's ability to be able to call for pain meds.  pt will need SNF at D/C to maximize Independence prior to return to ALF.      PT Assessment  Patient needs continued PT services    Follow Up Recommendations  SNF    Does the patient have the potential to tolerate intense rehabilitation      Barriers to Discharge None      Equipment Recommendations  Rolling walker with 5" wheels    Recommendations for Other Services OT consult   Frequency Min 3X/week    Precautions / Restrictions Precautions Precautions: Fall Restrictions Weight Bearing Restrictions: Yes RLE Weight Bearing: Weight bearing as tolerated   Pertinent Vitals/Pain Does not rate but indicate very painful with mobility.  RN made aware.        Mobility  Bed Mobility Bed Mobility: Supine to Sit;Sitting - Scoot to Edge of Bed Supine to Sit: 1: +2 Total assist Supine to Sit: Patient Percentage: 20% Sitting - Scoot to Edge of Bed: 1: +2 Total assist Sitting - Scoot to Edge of Bed: Patient Percentage: 0% Details for Bed Mobility Assistance: max cueing for step-by-step through mobility, also needing tactile cueing.   Transfers Transfers: Sit to Stand;Stand Pivot Transfers;Stand to Sit Sit to Stand: 1: +2 Total assist;With upper extremity assist;From bed Sit to Stand: Patient Percentage: 30% Stand to Sit: 1: +2 Total assist;To chair/3-in-1 Stand to Sit: Patient Percentage: 10% Stand Pivot Transfers: 1: +2 Total assist Stand Pivot Transfers: Patient Percentage: 10% Details for Transfer Assistance:  pt attempted to A with sit to stand, however as R hip became painful, pt with increased difficulty following directions.   Ambulation/Gait Ambulation/Gait Assistance: Not tested (comment) Stairs: No Wheelchair Mobility Wheelchair Mobility: No    Shoulder Instructions     Exercises     PT Diagnosis: Difficulty walking;Generalized weakness;Acute pain  PT Problem List: Decreased strength;Decreased range of motion;Decreased activity tolerance;Decreased balance;Decreased mobility;Decreased cognition;Decreased knowledge of use of DME;Decreased safety awareness;Pain PT Treatment Interventions: DME instruction;Gait training;Functional mobility training;Therapeutic activities;Therapeutic exercise;Balance training;Patient/family education   PT Goals Acute Rehab PT Goals PT Goal Formulation: Patient unable to participate in goal setting Time For Goal Achievement: 09/15/12 Potential to Achieve Goals: Good Pt will go Supine/Side to Sit: with supervision PT Goal: Supine/Side to Sit - Progress: Goal set today Pt will go Sit to Supine/Side: with supervision PT Goal: Sit to Supine/Side - Progress: Goal set today Pt will go Sit to Stand: with supervision PT Goal: Sit to Stand - Progress: Goal set today Pt will go Stand to Sit: with supervision PT Goal: Stand to Sit - Progress: Goal set today Pt will Transfer Bed to Chair/Chair to Bed: with supervision PT Transfer Goal: Bed to Chair/Chair to Bed - Progress: Goal set today Pt will Ambulate: >150 feet;with supervision;with rolling walker PT Goal: Ambulate - Progress: Goal set today  Visit Information  Last PT Received On: 09/01/12 Assistance Needed: +2    Subjective Data  Subjective: That's why my hip hurts so bad! -After PT explained why pt was in hospital.   Patient  Stated Goal: Home   Prior Functioning  Home Living Lives With: Spouse Additional Comments: pt with unclear answers for PLOF and home situation questions.  Per chart pt and  husband live at an ALF apartment.  Unclear how much A pt needed from husband at baseline.   Communication Communication: No difficulties    Cognition  Overall Cognitive Status: Impaired Area of Impairment: Attention;Memory;Following commands;Safety/judgement;Awareness of errors;Awareness of deficits;Problem solving;Executive functioning Arousal/Alertness: Awake/alert Orientation Level: Disoriented to;Place;Time;Situation Behavior During Session: WFL for tasks performed Current Attention Level: Sustained Attention - Other Comments: pt very easily distracted by pain.   Memory Deficits: Poor STM.   Following Commands: Follows one step commands inconsistently Safety/Judgement: Decreased safety judgement for tasks assessed;Decreased awareness of need for assistance Awareness of Errors: Assistance required to identify errors made;Assistance required to correct errors made Cognition - Other Comments: Unclear pt's baseline level of cognition.      Extremity/Trunk Assessment Right Lower Extremity Assessment RLE ROM/Strength/Tone: Deficits RLE ROM/Strength/Tone Deficits: Mobility and strength limited by pain and pt very guarded about any mobility with R LE.   RLE Sensation: WFL - Light Touch Left Lower Extremity Assessment LLE ROM/Strength/Tone: Deficits LLE ROM/Strength/Tone Deficits: pt seems to guard L LE due to pain in R hip/pelvis.   LLE Sensation: WFL - Light Touch Trunk Assessment Trunk Assessment: Normal   Balance Balance Balance Assessed: No  End of Session PT - End of Session Equipment Utilized During Treatment: Gait belt Activity Tolerance: Patient limited by pain Patient left: in chair;with call bell/phone within reach Nurse Communication: Mobility status;Patient requests pain meds  GP     Sunny Schlein, [place] [phone] 09/01/2012, 1:05 PM

## 2012-09-01 NOTE — Discharge Summary (Signed)
Physician Discharge Summary  Carla Hartman ZOX:[id number] DOB: February 16, 1922 DOA: 08/30/2012  PCP: No primary provider on file.  Admit date: 08/30/2012 Discharge date: 09/03/2012  Time spent: 40 minutes  Recommendations for Outpatient Follow-up:  1. Follow up with Dr Francena Hanly, orthopedic surgeon. 2. Follow up with SNF MD.   Discharge Diagnoses:  Active Problems:  Diabetes mellitus  Pelvic fracture  Hypertension  Leukocytosis  UTI (lower urinary tract infection)   Discharge Condition: Satisfactory.   Diet recommendation: Heart-Healthy.   Filed Weights   08/31/12 0900  Weight: 68.04 kg (150 lb)    History of present illness:  79 y.o. Female with history of Arthritis; Diabetes mellitus, Glaucoma and previous thyroidectomy, presenting following a fall. She was walking before dinner and tripped on the carpet, while using her walker. Denied hitting her head, no history of syncope, no recent chest pain, fever or chills. CT scan of the hip showed numerous pelvic fractures. Admitted for pain management and diabetes treatment.    Hospital Course:  1. Pelvic fracture: Patient presented following a mechanical fall, and associated right hip pain. Although X-Ray was devoid of acute findings, CT scan revealed multiple comminuted and displaced fractures of the body of the  right iliac bone, extending to the iliac crest and sacroiliac joint, as well as nondisplaced fractures of the right superior and inferior pubic rami. Dr Francena Hanly provided orthopedics consultation, and conservative management with pain-control, WBAT and PT/OT, was recommended. Patient was managed with analgesics, and improved significantly pain wise, during her hospitalization. For SNF rehab.  2. Diabetes mellitus: This was sub-optimally controlled, based on random blood glucose of 259 at presentation. Managed with diet, SSI/Glipizide, meal cover and scheduled Lantus, with satisfactory control. Metformin was discontinued,  due to advanced age.  3. Hypertension: Reasonably controlled on pre-admission anti-hypertensives.  4. Leukocytosis: Wcc was 19.4 on admission. This is likely due to margination, but earty UTI may have been contributory.  5. UTI: Urinalysis revealed a positive sediment with pyuria and microhematuria. Managed with 7-day course of empiric Levaquin to be concluded on 09/06/12. Urine culture revealed quinolone-sensitive E. Coli.     Procedures:  See Below.   Consultations:  Dr Caryn Bee Supple,orthopedic surgeon.   Discharge Exam: Filed Vitals:   09/02/12 2101 09/03/12 0600 09/03/12 0930 09/03/12 1025  BP: 114/80 138/89 120/60 120/60  Pulse: 91 81 87   Temp: 98.5 F (36.9 C) 98.5 F (36.9 C) 97.6 F (36.4 C)   TempSrc:   Oral   Resp: 18 18 18    Height:      Weight:      SpO2: 99% 98% 99%     General: Comfortable in chair, alert, communicative, fully oriented, not short of breath at rest.  HEENT: No clinical pallor, no jaundice, no conjunctival injection or discharge. Hydration is fair.  NECK: Supple, JVP not seen, no carotid bruits, no palpable lymphadenopathy, no palpable goiter.  CHEST: Clinically clear to auscultation, no wheezes, no crackles.  HEART: Sounds 1 and 2 heard, normal, regular, no murmurs.  ABDOMEN: Moderately obese, soft, non-tender, no palpable organomegaly, no palpable masses, normal bowel sounds.  GENITALIA: Not examined.  LOWER EXTREMITIES: No pitting edema, palpable peripheral pulses.  MUSCULOSKELETAL SYSTEM: Generalized osteoarthritic changes, otherwise, normal. Has pain on right hip movement. No obvious bruising.  CENTRAL NERVOUS SYSTEM: No focal neurologic deficit on gross examination.   Discharge Instructions      Discharge Orders    Future Orders Please Complete By Expires   Diet - low  sodium heart healthy      Diet Carb Modified      Increase activity slowly          Medication List     As of 09/03/2012  1:09 PM    STOP taking these medications          metFORMIN 500 MG tablet   Commonly known as: GLUCOPHAGE      TAKE these medications         acetaminophen 325 MG tablet   Commonly known as: TYLENOL   Take 2 tablets (650 mg total) by mouth every 6 (six) hours as needed (or Fever >/= 101).      DSS 100 MG Caps   Take 100 mg by mouth 2 (two) times daily.      glipiZIDE 10 MG 24 hr tablet   Commonly known as: GLUCOTROL XL   Take 10 mg by mouth daily.      insulin aspart 100 UNIT/ML injection   Commonly known as: novoLOG   For CBG 70-120=No Insulin; CBG 121-150=2 units; CBG 151-200=3 units; CBG 201-250=5 units; CBG 251-300=8 units; CBG 301-350=11 units; CBG 351-400=15 units.      insulin glargine 100 UNIT/ML injection   Commonly known as: LANTUS   Inject 10 Units into the skin at bedtime.      levofloxacin 500 MG tablet   Commonly known as: LEVAQUIN   Take 1 tablet (500 mg total) by mouth daily.      levothyroxine 50 MCG tablet   Commonly known as: SYNTHROID, LEVOTHROID   Take 50 mcg by mouth daily.      lisinopril 20 MG tablet   Commonly known as: PRINIVIL,ZESTRIL   Take 20 mg by mouth daily.      oxyCODONE-acetaminophen 5-325 MG per tablet   Commonly known as: PERCOCET/ROXICET   Take 1 tablet by mouth every 6 (six) hours as needed for pain.      pantoprazole 40 MG tablet   Commonly known as: PROTONIX   Take 1 tablet (40 mg total) by mouth daily at 12 noon.      traMADol 50 MG tablet   Commonly known as: ULTRAM   Take 0.5 tablets (25 mg total) by mouth 3 (three) times daily.         Follow-up Information    Schedule an appointment as soon as possible for a visit with Senaida Lange, MD.   Contact information:   [address] [place] [address] [phone]       Please follow up. (Follow up with SNF MD. )           The results of significant diagnostics from this hospitalization (including imaging, microbiology, ancillary and laboratory) are  listed below for reference.    Significant Diagnostic Studies: Dg Hip Complete Right  08/30/2012  *RADIOLOGY REPORT*  Clinical Data: Fall. Right hip pain.  RIGHT HIP - COMPLETE 2+ VIEW  Comparison: None.  Findings: No evidence of hip fracture or dislocation.  Generalized osteopenia noted.  Old fracture deformities are seen involving the right ileum and pubis.  IMPRESSION:  1.  No acute findings. 2.  Osteopenia and old right pelvic fracture deformities.   Original Report Authenticated By: Myles Rosenthal, M.D.    Ct Hip Right Wo Contrast  08/31/2012  *RADIOLOGY REPORT*  Clinical Data: Right hip pain after fall.  CT OF THE RIGHT HIP WITHOUT CONTRAST  Technique:  Multidetector CT imaging was performed according to  the standard protocol. Multiplanar CT image reconstructions were also generated.  Comparison: Right hip 08/30/2012  Findings: The there is an acute comminuted fracture of the right hemi pelvis involving the body of the iliac bone extending to the iliac crests superiorly and to the sacroiliac joint medially. There is impaction and displacement of the fracture fragments. There is associated hematoma involving the gluteal and iliopsoas muscles. Nondisplaced fractures of the distal aspect of the superior and inferior pubic rami on the right.  The acetabulum, right hip, and symphysis pubis appear intact. Degenerative changes in the right hip.  IMPRESSION: Multiple comminuted and displaced fractures of the body of the right iliac bone extending to the iliac crest and sacroiliac joint. Nondisplaced fractures of the right superior and inferior pubic rami.   Original Report Authenticated By: Burman Nieves, M.D.     Microbiology: Recent Results (from the past 240 hour(s))  URINE CULTURE     Status: Normal   Collection Time   08/31/12  5:32 AM      Component Value Range Status Comment   Specimen Description URINE, CATHETERIZED   Final    Special Requests NONE   Final    Culture  Setup Time 08/31/2012 17:02    Final    Colony Count >=100,000 COLONIES/ML   Final    Culture ESCHERICHIA COLI   Final    Report Status 09/02/2012 FINAL   Final    Organism ID, Bacteria ESCHERICHIA COLI   Final   URINE CULTURE     Status: Normal   Collection Time   08/31/12  4:30 PM      Component Value Range Status Comment   Specimen Description URINE, CATHETERIZED   Final    Special Requests NONE   Final    Culture  Setup Time 08/31/2012 20:06   Final    Colony Count NO GROWTH   Final    Culture NO GROWTH   Final    Report Status 09/01/2012 FINAL   Final      Labs: Basic Metabolic Panel:  Lab 09/03/12 5409 09/02/12 0745 09/01/12 0625 08/31/12 0634 08/31/12 0203  NA 131* 129* 132* 137 131*  K 3.9 3.1* 3.6 3.7 3.8  CL 94* 94* 96 100 96  CO2 26 22 30 27 23   GLUCOSE 135* 223* 167* 211* 259*  BUN 23 9 13 17 15   CREATININE 0.79 0.37* 0.50 0.58 0.48*  CALCIUM 8.8 8.8 8.6 8.9 8.9  MG -- -- -- 2.1 --  PHOS -- -- -- 3.8 --   Liver Function Tests:  Lab 08/31/12 0634  AST 21  ALT 17  ALKPHOS 66  BILITOT 0.6  PROT 6.2  ALBUMIN 3.1*   No results found for this basename: LIPASE:5,AMYLASE:5 in the last 168 hours No results found for this basename: AMMONIA:5 in the last 168 hours CBC:  Lab 09/03/12 0505 09/02/12 0745 09/01/12 0625 08/31/12 0634 08/31/12 0203  WBC 12.1* 16.4* 12.5* 17.7* 19.4*  NEUTROABS -- -- -- -- --  HGB 13.7 14.9 12.7 13.4 14.5  HCT 38.4 41.4 36.8 38.6 41.1  MCV 88.5 87.7 90.2 91.0 89.3  PLT 274 251 250 268 262   Cardiac Enzymes: No results found for this basename: CKTOTAL:5,CKMB:5,CKMBINDEX:5,TROPONINI:5 in the last 168 hours BNP: BNP (last 3 results) No results found for this basename: PROBNP:3 in the last 8760 hours CBG:  Lab 09/03/12 1140 09/03/12 0640 09/02/12 2118 09/02/12 1700 09/02/12 1140  GLUCAP 135* 137* 141* 143* 142*  Signed:  Symir Mah,CHRISTOPHER  [facility] 09/03/2012, 1:09 PM

## 2012-09-01 NOTE — Progress Notes (Signed)
Pt is alert oriented to self /place only.States she does not remember falling at home,or why she is here.Asking what her husbands name is. Short and long term memory deficits noted.Denies hitting her head when she fell at home.High fall risk measures in place.Denies any pain at this time.VSS. Linward Headland D

## 2012-09-01 NOTE — Progress Notes (Signed)
[facility] PROGRESS NOTE  Carla FERRYMAN WUJ:[id number] DOB: [birth year] DOA: 08/30/2012 PCP: No primary provider on file.  Assessment/Plan: Active Problems:  Diabetes mellitus  Pelvic fracture  Hypertension  Leukocytosis  UTI (lower urinary tract infection)    1. Pelvic fracture: Patient presented following a mechanical fall, and associated right hip pain. Although X-Ray was devoid of acute findings, CT scan revealed multiple comminuted and displaced fractures of the body of the  right iliac bone extending to the iliac crest and sacroiliac joint, as well as nondisplaced fractures of the right superior and inferior pubic rami. managing with analgesics. Dr Francena Hanly provided orthopedics consultation, and conservative management with pain- control, WBAT  And Pt/OT, is recommended. Today, patient is much more comfortable.  2. Diabetes mellitus: This is sub-optimally controlled, based on random blood glucose of 259 at presentation. Managing with diet, SSI/Glipizide and meal cover, with some improvement. Metformin is currently on hold. Have commenced Lantus today.  3. Hypertension: reasonably controlled. on pre-admission anti-hypertensives.  4. Leukocytosis: Wcc was 19.4 on admission. This is likely due to margination, but earty UTI may be contributory. Following CBC, which is trending down nicely.  5. UTI: Urinalysis revealed a positive sediment with pyuria and microhematuria. On empiric Levaquin day#2 and awaiting cultures.    Code Status: DNR/DNI Family Communication:  Disposition Plan: To be determined.   Brief narrative: 79 y.o. Female with history of Arthritis; Diabetes mellitus, Glaucoma and previous thyroidectomy, presenting following a fall. She was walking before dinner and tripped on the carpet, while using her walker. Denies hitting her head, no hx of syncope, no recent chest pain, fever or chills. CT scan of the hip showed numerous pelvic fractures. The Orthopedics have  been notified and will see her in AM. Admitted for pain management and diabetes treatment.    Consultants:  Orthopedic surgeon.   Procedures:  X-Ray right hip.   CT scan of right hip.   Antibiotics:  Levaquin 08/31/12>>>  HPI/Subjective: Feels better.   Objective: Vital signs in last 24 hours: Temp:  [98.4 F (36.9 C)-98.6 F (37 C)] 98.6 F (37 C) (02/02 0740) Pulse Rate:  [86-89] 89  (02/02 0740) Resp:  [16-18] 16  (02/02 0740) BP: (117-160)/(40-62) 160/62 mmHg (02/02 0740) SpO2:  [92 %-98 %] 92 % (02/02 0740) Weight change:  Last BM Date: 08/30/12 (pt thinks day of adm)  Intake/Output from previous day: 02/01 0701 - 02/02 0700 In: 483 [P.O.:480; I.V.:3] Out: 1600 [Urine:1600]     Physical Exam: General: Comfortable in chair, alert, communicative, fully oriented, not short of breath at rest.  HEENT:  No clinical pallor, no jaundice, no conjunctival injection or discharge. Hydration is fair.  NECK:  Supple, JVP not seen, no carotid bruits, no palpable lymphadenopathy, no palpable goiter. CHEST:  Clinically clear to auscultation, no wheezes, no crackles. HEART:  Sounds 1 and 2 heard, normal, regular, no murmurs. ABDOMEN:  Moderately obese, soft, non-tender, no palpable organomegaly, no palpable masses, normal bowel sounds. GENITALIA:  Not examined. LOWER EXTREMITIES:  No pitting edema, palpable peripheral pulses. MUSCULOSKELETAL SYSTEM:  Generalized osteoarthritic changes, otherwise, normal. Has pain on right hip movement. No obvious bruising.  CENTRAL NERVOUS SYSTEM:  No focal neurologic deficit on gross examination.  Lab Results:  Basename 09/01/12 0625 08/31/12 0634  WBC 12.5* 17.7*  HGB 12.7 13.4  HCT 36.8 38.6  PLT 250 268    Basename 09/01/12 0625 08/31/12 0634  NA 132* 137  K 3.6 3.7  CL 96 100  CO2 30 27  GLUCOSE 167* 211*  BUN 13 17  CREATININE 0.50 0.58  CALCIUM 8.6 8.9   No results found for this or any previous visit (from the past 240  hour(s)).   Studies/Results: Dg Hip Complete Right  08/30/2012  *RADIOLOGY REPORT*  Clinical Data: Fall. Right hip pain.  RIGHT HIP - COMPLETE 2+ VIEW  Comparison: None.  Findings: No evidence of hip fracture or dislocation.  Generalized osteopenia noted.  Old fracture deformities are seen involving the right ileum and pubis.  IMPRESSION:  1.  No acute findings. 2.  Osteopenia and old right pelvic fracture deformities.   Original Report Authenticated By: Myles Rosenthal, M.D.    Ct Hip Right Wo Contrast  08/31/2012  *RADIOLOGY REPORT*  Clinical Data: Right hip pain after fall.  CT OF THE RIGHT HIP WITHOUT CONTRAST  Technique:  Multidetector CT imaging was performed according to the standard protocol. Multiplanar CT image reconstructions were also generated.  Comparison: Right hip 08/30/2012  Findings: The there is an acute comminuted fracture of the right hemi pelvis involving the body of the iliac bone extending to the iliac crests superiorly and to the sacroiliac joint medially. There is impaction and displacement of the fracture fragments. There is associated hematoma involving the gluteal and iliopsoas muscles. Nondisplaced fractures of the distal aspect of the superior and inferior pubic rami on the right.  The acetabulum, right hip, and symphysis pubis appear intact. Degenerative changes in the right hip.  IMPRESSION: Multiple comminuted and displaced fractures of the body of the right iliac bone extending to the iliac crest and sacroiliac joint. Nondisplaced fractures of the right superior and inferior pubic rami.   Original Report Authenticated By: Burman Nieves, M.D.     Medications: Scheduled Meds:    . docusate sodium  100 mg Oral BID  . enoxaparin (LOVENOX) injection  40 mg Subcutaneous Q24H  . glipiZIDE  10 mg Oral Daily  . hydrALAZINE  10 mg Intravenous Once  . insulin aspart  0-15 Units Subcutaneous TID WC  . insulin aspart  4 Units Subcutaneous TID WC  . levofloxacin  500 mg Oral  Daily  . levothyroxine  50 mcg Oral Daily  . lisinopril  20 mg Oral Daily  . pantoprazole  40 mg Oral Q1200  . sodium chloride  3 mL Intravenous Q12H  . traMADol  25 mg Oral TID   Continuous Infusions:  PRN Meds:.acetaminophen, acetaminophen, HYDROcodone-acetaminophen, morphine injection, ondansetron (ZOFRAN) IV, ondansetron    LOS: 2 days   Carla Hartman,CHRISTOPHER  [facility] Pager [phone]. If 8PM-8AM, please contact night-coverage at [email or web address], password [facility] 09/01/2012, 1:18 PM  LOS: 2 days

## 2012-09-02 LAB — CBC
HCT: 41.4 % (ref 36.0–46.0)
Hemoglobin: 14.9 g/dL (ref 12.0–15.0)
MCHC: 36 g/dL (ref 30.0–36.0)

## 2012-09-02 LAB — URINE CULTURE

## 2012-09-02 LAB — BASIC METABOLIC PANEL
BUN: 9 mg/dL (ref 6–23)
CO2: 22 mEq/L (ref 19–32)
Chloride: 94 mEq/L — ABNORMAL LOW (ref 96–112)
GFR calc non Af Amer: >90 mL/min (ref >90)
Glucose, Bld: 223 mg/dL — ABNORMAL HIGH (ref 70–99)
Potassium: 3.1 mEq/L — ABNORMAL LOW (ref 3.5–5.1)

## 2012-09-02 LAB — GLUCOSE, CAPILLARY
Glucose-Capillary: 152 mg/dL — ABNORMAL HIGH (ref 70–99)
Glucose-Capillary: 271 mg/dL — ABNORMAL HIGH (ref 70–99)
Glucose-Capillary: 142 mg/dL — ABNORMAL HIGH (ref 70–99)
Glucose-Capillary: 143 mg/dL — ABNORMAL HIGH (ref 70–99)
Glucose-Capillary: 141 mg/dL — ABNORMAL HIGH (ref 70–99)

## 2012-09-02 MED ORDER — INSULIN ASPART 100 UNIT/ML ~~LOC~~ SOLN
6.0000 [IU] | Freq: Three times a day (TID) | SUBCUTANEOUS | Status: DC
  Administered 2012-09-03 (×2): 6 [IU] via SUBCUTANEOUS

## 2012-09-02 MED ORDER — POTASSIUM CHLORIDE CRYS ER 20 MEQ PO TBCR
40.0000 meq | EXTENDED_RELEASE_TABLET | Freq: Once | ORAL | Status: AC
  Administered 2012-09-02: 40 meq via ORAL
  Filled 2012-09-02: qty 2

## 2012-09-02 NOTE — Progress Notes (Signed)
UR COMPLETED  

## 2012-09-02 NOTE — Progress Notes (Signed)
Inpatient Diabetes Program Recommendations  AACE/ADA: New Consensus Statement on Inpatient Glycemic Control (2013)  Target Ranges:  Prepandial:   less than 140 mg/dL      Peak postprandial:   less than 180 mg/dL (1-2 hours)      Critically ill patients:  140 - 180 mg/dL    Results for KC, SEDLAK (MRN [id number]) as of 09/02/2012 12:16  Ref. Range 09/01/2012 07:42 09/01/2012 11:07 09/01/2012 16:38 09/01/2012 19:44 09/01/2012 23:46 09/02/2012 09:10 09/02/2012 11:40  Glucose-Capillary Latest Range: 70-99 mg/dL 409 (H) 811 (H) 914 (H) 146 (H) 152 (H) 271 (H) 142 (H)     Note: MD please note that according to the chart the patient did not receive but half of Lantus dose last night at bedtime.  Noted in chart that C. Meriam Sprague, RN has charted Lantus 5 units given on 2/2 @ 00:00 with note "patient not eating well only wanted half dose lantus".  Wanted to bring to MD's attention so that insulin adjustments would not be made based on the misconception that patient had received insulin as ordered.  BEDSIDE NURSING STAFF:  Please give Lantus dose as ordered by the physician and explain to the patient that Lantus is for basal needs.  If the patient refuses to take medication as prescribed, please let the physician know.  Please do not give Novolog meal coverage if patient is not eating at least 50% of meals and explain to the patient that Novolog meal coverage is the insulin that would not be given if she were not eating well.     Will continue to follow.  Thanks, Orlando Penner, RN, BSN, CCRN Diabetes Coordinator Inpatient Diabetes Program [phone]

## 2012-09-02 NOTE — Progress Notes (Addendum)
Clinical Social Work Department  BRIEF PSYCHOSOCIAL ASSESSMENT  Patient: Carla Hartman  Account Number: [id number] Admit date: 09/02/2012 Clinical Social Worker Sabino Niemann, MSW Date/Time:  Referred by: Physician Date Referred: 09/02/2012 Referred for   SNF Placement   Other Referral:  Interview type: Patient  Other interview type:PSYCHOSOCIAL DATA  Living Status: ALF- [facility] Admitted from facility: [facility] Level of care: ALF Primary support name: Godino,Russell   Primary support relationship to patient: Spouse Degree of support available:  Strong and vested  CURRENT CONCERNS  Current Concerns   Post-Acute Placement   Other Concerns:  SOCIAL WORK ASSESSMENT / PLAN  CSW met with pt re: PT recommendation for SNF.   [facility] at [facility]   CSW explained placement process and answered questions.   Pt [place] [facility] as her preference    CSW completed FL2 and isent Clinicals to [employer].     Assessment/plan status: Information/Referral to [facility]  Other assessment/ plan:  Information/referral to community resources:  SNF   PTAR   PATIENT'S/FAMILY'S RESPONSE TO PLAN OF CARE:  Pt  Reports she is agreeable to ST SNF in order to increase strength and independence with mobility prior to return home  Pt verbalized understanding of placement process and appreciation for CSW assist.   Sabino Niemann, MSW [phone]

## 2012-09-02 NOTE — Plan of Care (Signed)
Problem: Phase II Progression Outcomes Goal: Discharge plan established Recommend SNF for further therapy needs after acute care d/c     

## 2012-09-02 NOTE — Evaluation (Signed)
Occupational Therapy Evaluation Patient Details Name: Carla Hartman MRN: [id number] DOB: 02-Feb-1922 Today's Date: 09/02/2012 Time: 7829-5621 OT Time Calculation (min): 29 min  OT Assessment / Plan / Recommendation Clinical Impression  Pt demos decline in function with ADLs, safety, activity tolerance, balance and strength following pelvic fracture. Pt would benefit from OT services to address these impairments to retsore PLOF to return home safely    OT Assessment  Patient needs continued OT Services    Follow Up Recommendations  SNF    Barriers to Discharge Decreased caregiver support Uncertain if pt's husband is able to provide adequate care needed at home at this time  Equipment Recommendations  Other (comment);Tub/shower seat;3 in 1 bedside comode (TBD at SNF level of care)    Recommendations for Other Services    Frequency  Min 2X/week    Precautions / Restrictions Precautions Precautions: Fall Restrictions Weight Bearing Restrictions: Yes RLE Weight Bearing: Weight bearing as tolerated   Pertinent Vitals/Pain     ADL  Eating/Feeding: Set up Where Assessed - Eating/Feeding: Chair Grooming: Performed;Wash/dry hands;Set up Where Assessed - Grooming: Supported sitting Upper Body Bathing: Simulated;Supervision/safety;Set up Where Assessed - Upper Body Bathing: Supported sitting Lower Body Bathing: Simulated;Maximal assistance Where Assessed - Lower Body Bathing: Supported sitting Upper Body Dressing: Performed;Supervision/safety;Set up Where Assessed - Upper Body Dressing: Supported sitting Lower Body Dressing: +1 Total assistance Where Assessed - Lower Body Dressing: Supported sitting Toilet Transfer: Performed;+1 Total assistance Toilet Transfer Method: Sit to Barista: Bedside commode Toileting - Clothing Manipulation and Hygiene: Supervision/safety Where Assessed - Toileting Clothing Manipulation and Hygiene: Sit on 3-in-1 or  toilet Equipment Used: Gait belt;Other (comment) (BSC) Transfers/Ambulation Related to ADLs: pt required cues for correct hand placement, safety ADL Comments: Required use of BSC    OT Diagnosis: Generalized weakness;Acute pain  OT Problem List: Decreased strength;Decreased cognition;Decreased safety awareness;Pain;Decreased knowledge of precautions;Impaired balance (sitting and/or standing);Decreased knowledge of use of DME or AE;Decreased activity tolerance OT Treatment Interventions: Self-care/ADL training;Therapeutic activities;Therapeutic exercise;Neuromuscular education;DME and/or AE instruction;Patient/family education;Balance training   OT Goals Acute Rehab OT Goals OT Goal Formulation: With patient Time For Goal Achievement: 09/02/12 Potential to Achieve Goals: Good ADL Goals Pt Will Perform Grooming: with set-up;Sitting, edge of bed;Unsupported ADL Goal: Grooming - Progress: Goal set today Pt Will Perform Lower Body Bathing: with mod assist;with adaptive equipment;Sitting, chair;Sitting, edge of bed ADL Goal: Lower Body Bathing - Progress: Goal set today Pt Will Perform Lower Body Dressing: with max assist;with mod assist;Sitting, chair;Sitting, bed;Sit to stand from chair;Sit to stand from bed;with adaptive equipment ADL Goal: Lower Body Dressing - Progress: Goal set today Pt Will Transfer to Toilet: with mod assist;with max assist;with DME;Grab bars ADL Goal: Toilet Transfer - Progress: Goal set today Pt Will Perform Toileting - Clothing Manipulation: with min assist;with mod assist;Standing ADL Goal: Toileting - Clothing Manipulation - Progress: Goal set today  Visit Information  Last OT Received On: 09/02/12 Assistance Needed: +2 PT/OT Co-Evaluation/Treatment: Yes    Subjective Data  Subjective: " I would like to go to the bathroom " Patient Stated Goal: To return home   Prior Functioning     Home Living Lives With: Spouse Type of Home: Apartment Home Access:  Level entry Home Layout: One level Bathroom Shower/Tub: Tub/shower unit Additional Comments: pt answers unclear as to what PLOF and environment Prior Function Level of Independence: Independent Vocation: Retired Musician: No difficulties Dominant Hand: Right         Vision/Perception Vision -  History Baseline Vision: Wears glasses all the time Patient Visual Report: No change from baseline Vision - Assessment Eye Alignment: Within Functional Limits Perception Perception: Within Functional Limits   Cognition  Cognition Overall Cognitive Status: Impaired Area of Impairment: Attention;Memory;Following commands;Safety/judgement;Awareness of errors;Awareness of deficits Arousal/Alertness: Awake/alert Orientation Level: Disoriented to;Place;Time;Situation Behavior During Session: [facility] for tasks performed Current Attention Level: Sustained Attention - Other Comments: easily distracted Memory Deficits: After being seated onto BSc, pt did not remember that she was seated on a BSC Following Commands: Follows one step commands inconsistently Safety/Judgement: Decreased awareness of safety precautions;Decreased safety judgement for tasks assessed;Decreased awareness of need for assistance Awareness of Errors: Assistance required to identify errors made;Assistance required to correct errors made Cognition - Other Comments: pt unabke to recall that [facility] was available sittnig 2 feet away from her    Extremity/Trunk Assessment Right Upper Extremity Assessment RUE ROM/Strength/Tone: [facility] for tasks assessed Left Upper Extremity Assessment LUE ROM/Strength/Tone: [facility] for tasks assessed     Mobility Bed Mobility Bed Mobility: Not assessed Transfers Transfers: Sit to Stand;Stand to Sit Sit to Stand: 1: +2 Total assist;With upper extremity assist;With armrests;From chair/3-in-1 Sit to Stand: Patient Percentage: 40% Stand to Sit: 1: +2 Total assist;With armrests;To  chair/3-in-1 Stand to Sit: Patient Percentage: 10% Details for Transfer Assistance: pt required cues for safety, correct hand placemet     Exercise     Balance Balance Balance Assessed: No   End of Session OT - End of Session Equipment Utilized During Treatment: Gait belt;Other (comment) (BSC) Activity Tolerance: Patient limited by pain;Patient limited by fatigue;Other (comment) (Pt reported dizziness and nausea, nrsg made aware) Patient left: in chair;with call bell/phone within reach;with nursing in room;Other (comment) (BP 111/45 after returning to recliner) Nurse Communication: Other (comment) (dizziness and nausea reported to nrsg)  GO Functional Limitation: Self care Self Care Current Status (G4010): At least 60 percent but less than 80 percent impaired, limited or restricted Self Care Goal Status (U7253): At least 40 percent but less than 60 percent impaired, limited or restricted   Galen Manila 09/02/2012, 2:46 PM

## 2012-09-02 NOTE — Clinical Social Work Placement (Signed)
Clinical Social Work Department  CLINICAL SOCIAL WORK PLACEMENT NOTE  09/02/2012 Patient: Carla Hartman  Account Number:  [id number] Admit date: 08/30/12 Clinical Social Worker: Sabino Niemann MSW Date/time: 09/02/2012 11:30 AM  Clinical Social Work is seeking post-discharge placement for this patient at the following level of care: SKILLED NURSING (*CSW will update this form in Epic as items are completed)  09/02/2012 Patient/family provided with [facility] Department of Clinical Social Work's list of facilities offering this level of care within the geographic area requested by the patient (or if unable, by the patient's family).  2/3/2014Patient/family informed of their freedom to choose among providers that offer the needed level of care, that participate in Medicare, Medicaid or managed care program needed by the patient, have an available bed and are willing to accept the patient.  09/02/2012 Patient/family informed of [facility]' ownership interest in [facility], as well as of the fact that they are under no obligation to receive care at this facility.  PASARR submitted to EDS on   PASARR number received from EDS on  FL2 transmitted to all facilities in geographic area requested by pt/family on 09/02/2012  FL2 transmitted to all facilities within larger geographic area on  Patient informed that his/her managed care company has contracts with or will negotiate with certain facilities, including the following:  Patient/family informed of bed offers received: Patient chooses bed at  Physician recommends and patient chooses bed at  Patient to be transferred to on  Patient to be transferred to facility by  The following physician request were entered in Epic:  Additional Comments:

## 2012-09-02 NOTE — Progress Notes (Signed)
Upon doing rounds,found pt had pulled out foley with balloon still inflated, small amt blood noted from vaginal area.Pt also had pulled out nsl in left posterior forearm,Cath intact.Pt no longer on ivf and scheduled for d/c in the am to snf.Did not restart IV.Pt states she does not remember pulling them out, and that she believes she has dementia and would like her pcp to order some meds after d/c to help with her memory loss.Linward Headland D

## 2012-09-02 NOTE — Progress Notes (Signed)
Physical Therapy Treatment Patient Details Name: Carla Hartman MRN: [id number] DOB: Oct 19, 1921 Today's Date: 09/02/2012 Time: 0865-7846 PT Time Calculation (min): 25 min  PT Assessment / Plan / Recommendation Comments on Treatment Session  pt presents with R Pelvic fxs.  pt continues with cognitive deficits and very painful during mobility.  pt will need SNF at D/C.      Follow Up Recommendations  SNF     Does the patient have the potential to tolerate intense rehabilitation     Barriers to Discharge        Equipment Recommendations  Rolling walker with 5" wheels    Recommendations for Other Services    Frequency Min 3X/week   Plan Discharge plan remains appropriate;Frequency remains appropriate    Precautions / Restrictions Precautions Precautions: Fall Restrictions Weight Bearing Restrictions: Yes RLE Weight Bearing: Weight bearing as tolerated   Pertinent Vitals/Pain Does not rate, but very painful with mobilty.  RN aware.      Mobility  Bed Mobility Bed Mobility: Not assessed Transfers Transfers: Sit to Stand;Stand to Sit;Stand Pivot Transfers Sit to Stand: 1: +2 Total assist;With upper extremity assist;From chair/3-in-1;With armrests Sit to Stand: Patient Percentage: 40% Stand to Sit: 1: +2 Total assist;With upper extremity assist;To chair/3-in-1;With armrests Stand to Sit: Patient Percentage: 10% Stand Pivot Transfers: 1: +2 Total assist Stand Pivot Transfers: Patient Percentage: 10% Details for Transfer Assistance: pt able to A with sit to stand, however unable to WB at all on R LE.  Strong cueing needed for safety during transfers as pt gets distracted by pain.  Whilte sitting on 3-in-1 pt began to c/o nausea and feeling lightheaded.  pt returned to chair and BP checked at 111/45.  RN made aware.   Ambulation/Gait Ambulation/Gait Assistance: Not tested (comment) Stairs: No Wheelchair Mobility Wheelchair Mobility: No    Exercises     PT Diagnosis:    PT  Problem List:   PT Treatment Interventions:     PT Goals Acute Rehab PT Goals Time For Goal Achievement: 09/15/12 Potential to Achieve Goals: Good PT Goal: Sit to Stand - Progress: Progressing toward goal PT Goal: Stand to Sit - Progress: Progressing toward goal PT Transfer Goal: Bed to Chair/Chair to Bed - Progress: Progressing toward goal  Visit Information  Last PT Received On: 09/02/12 Assistance Needed: +2 PT/OT Co-Evaluation/Treatment: Yes    Subjective Data  Subjective: Oh, am I sitting on a commode?     Cognition  Cognition Overall Cognitive Status: Impaired Area of Impairment: Attention;Memory;Following commands;Safety/judgement;Awareness of errors;Awareness of deficits;Problem solving;Executive functioning Arousal/Alertness: Awake/alert Orientation Level: Disoriented to;Place;Time;Situation Behavior During Session: WFL for tasks performed Current Attention Level: Sustained Attention - Other Comments: pt very easily distracted by pain.   Memory Deficits: Poor STM.   Following Commands: Follows one step commands inconsistently Safety/Judgement: Decreased safety judgement for tasks assessed;Decreased awareness of need for assistance Awareness of Errors: Assistance required to identify errors made;Assistance required to correct errors made Cognition - Other Comments: Unclear pt's baseline level of cognition.      Balance  Balance Balance Assessed: No  End of Session PT - End of Session Equipment Utilized During Treatment: Gait belt Activity Tolerance: Patient limited by pain Patient left: in chair;with call bell/phone within reach Nurse Communication: Mobility status   GP     Carla Hartman, [place] [phone] 09/02/2012, 1:38 PM

## 2012-09-02 NOTE — Progress Notes (Signed)
[facility] PROGRESS NOTE  Carla Hartman AVW:[id number] DOB: [birth year] DOA: 08/30/2012 PCP: No primary provider on file.  Assessment/Plan: Active Problems:  Diabetes mellitus  Pelvic fracture  Hypertension  Leukocytosis  UTI (lower urinary tract infection)    1. Pelvic fracture: Patient presented following a mechanical fall, and associated right hip pain. Although X-Ray was devoid of acute findings, CT scan revealed multiple comminuted and displaced fractures of the body of the  right iliac bone extending to the iliac crest and sacroiliac joint, as well as nondisplaced fractures of the right superior and inferior pubic rami. managing with analgesics. Dr Carla Hartman provided orthopedics consultation, and conservative management with pain- control, WBAT, PT/OT, and SNF placement is recommended. Pain has significantly improved. Continue current management. 2. Diabetes mellitus: This is sub-optimally controlled, based on random blood glucose of 259 at presentation. Managing with diet, SSI/Glipizide and meal cover, with some improvement. Metformin is currently on hold. Have commenced Lantus as of 09/01/12, with satisfactory conterol.  3. Hypertension: Reasonably controlled. on pre-admission anti-hypertensives.  4. Leukocytosis: Wcc was 19.4 on admission. This is likely due to margination, but earty UTI may be contributory. Following CBC, which is trending down nicely.  5. UTI: Urinalysis revealed a positive sediment with pyuria and microhematuria. On empiric Levaquin day#3 urine culture has revealed quinolone-sensitive E. Coli. Will complete antibiotic therapy on 09/06/12.    Code Status: DNR/DNI Family Communication:  Disposition Plan: To be determined. For SNF on 09/03/12.    Brief narrative: 79 y.o. Female with history of Arthritis; Diabetes mellitus, Glaucoma and previous thyroidectomy, presenting following a fall. She was walking before dinner and tripped on the carpet, while using  her walker. Denies hitting her head, no hx of syncope, no recent chest pain, fever or chills. CT scan of the hip showed numerous pelvic fractures. The Orthopedics have been notified and will see her in AM. Admitted for pain management and diabetes treatment.    Consultants:  Orthopedic surgeon.   Procedures:  X-Ray right hip.   CT scan of right hip.   Antibiotics:  Levaquin 08/31/12>>>  HPI/Subjective: No new issues.   Objective: Vital signs in last 24 hours: Temp:  [98.7 F (37.1 C)-99 F (37.2 C)] 98.7 F (37.1 C) (02/03 0649) Pulse Rate:  [85-100] 85  (02/03 0649) Resp:  [18] 18  (02/03 0649) BP: (126-147)/(61-82) 126/68 mmHg (02/03 1130) SpO2:  [98 %-100 %] 100 % (02/03 0649) Weight change:  Last BM Date: 08/30/12  Intake/Output from previous day: 02/02 0701 - 02/03 0700 In: 360 [P.O.:360] Out: 750 [Urine:750]     Physical Exam: General: Comfortable in chair, alert, communicative, fully oriented, not short of breath at rest.  HEENT:  No clinical pallor, no jaundice, no conjunctival injection or discharge. Hydration is fair.  NECK:  Supple, JVP not seen, no carotid bruits, no palpable lymphadenopathy, no palpable goiter. CHEST:  Clinically clear to auscultation, no wheezes, no crackles. HEART:  Sounds 1 and 2 heard, normal, regular, no murmurs. ABDOMEN:  Moderately obese, soft, non-tender, no palpable organomegaly, no palpable masses, normal bowel sounds. GENITALIA:  Not examined. LOWER EXTREMITIES:  No pitting edema, palpable peripheral pulses. MUSCULOSKELETAL SYSTEM:  Generalized osteoarthritic changes, otherwise, normal. Has pain on right hip movement. No obvious bruising.  CENTRAL NERVOUS SYSTEM:  No focal neurologic deficit on gross examination.  Lab Results:  Basename 09/02/12 0745 09/01/12 0625  WBC 16.4* 12.5*  HGB 14.9 12.7  HCT 41.4 36.8  PLT 251 250    Basename 09/02/12  0745 09/01/12 0625  NA 129* 132*  K 3.1* 3.6  CL 94* 96  CO2 22 30   GLUCOSE 223* 167*  BUN 9 13  CREATININE 0.37* 0.50  CALCIUM 8.8 8.6   Recent Results (from the past 240 hour(s))  URINE CULTURE     Status: Normal   Collection Time   08/31/12  5:32 AM      Component Value Range Status Comment   Specimen Description URINE, CATHETERIZED   Final    Special Requests NONE   Final    Culture  Setup Time 08/31/2012 17:02   Final    Colony Count >=100,000 COLONIES/ML   Final    Culture ESCHERICHIA COLI   Final    Report Status 09/02/2012 FINAL   Final    Organism ID, Bacteria ESCHERICHIA COLI   Final   URINE CULTURE     Status: Normal   Collection Time   08/31/12  4:30 PM      Component Value Range Status Comment   Specimen Description URINE, CATHETERIZED   Final    Special Requests NONE   Final    Culture  Setup Time 08/31/2012 20:06   Final    Colony Count NO GROWTH   Final    Culture NO GROWTH   Final    Report Status 09/01/2012 FINAL   Final      Studies/Results: No results found.  Medications: Scheduled Meds:    . docusate sodium  100 mg Oral BID  . enoxaparin (LOVENOX) injection  40 mg Subcutaneous Q24H  . glipiZIDE  10 mg Oral Daily  . hydrALAZINE  10 mg Intravenous Once  . insulin aspart  0-15 Units Subcutaneous TID WC  . insulin aspart  4 Units Subcutaneous TID WC  . insulin glargine  10 Units Subcutaneous QHS  . levofloxacin  500 mg Oral Daily  . levothyroxine  50 mcg Oral Daily  . lisinopril  20 mg Oral Daily  . pantoprazole  40 mg Oral Q1200  . sodium chloride  3 mL Intravenous Q12H  . traMADol  25 mg Oral TID   Continuous Infusions:  PRN Meds:.acetaminophen, acetaminophen, HYDROcodone-acetaminophen, morphine injection, ondansetron (ZOFRAN) IV, ondansetron    LOS: 3 days   Carla Hartman,Carla Hartman  [facility] Pager [phone]. If 8PM-8AM, please contact night-coverage at [email or web address], password [facility] 09/02/2012, 12:36 PM  LOS: 3 days

## 2012-09-03 LAB — CBC
Hemoglobin: 13.7 g/dL (ref 12.0–15.0)
Platelets: 274 10*3/uL (ref 150–400)
RBC: 4.34 MIL/uL (ref 3.87–5.11)
WBC: 12.1 10*3/uL — ABNORMAL HIGH (ref 4.0–10.5)

## 2012-09-03 LAB — BASIC METABOLIC PANEL
CO2: 26 mEq/L (ref 19–32)
Calcium: 8.8 mg/dL (ref 8.4–10.5)
Chloride: 94 mEq/L — ABNORMAL LOW (ref 96–112)
Glucose, Bld: 135 mg/dL — ABNORMAL HIGH (ref 70–99)
Potassium: 3.9 mEq/L (ref 3.5–5.1)
Sodium: 131 mEq/L — ABNORMAL LOW (ref 135–145)

## 2012-09-03 LAB — GLUCOSE, CAPILLARY
Glucose-Capillary: 137 mg/dL — ABNORMAL HIGH (ref 70–99)
Glucose-Capillary: 135 mg/dL — ABNORMAL HIGH (ref 70–99)

## 2012-09-03 MED ORDER — TRAMADOL HCL 50 MG PO TABS: 25.0000 mg | ORAL_TABLET | Freq: Three times a day (TID) | ORAL | Status: DC

## 2012-09-03 MED ORDER — HYDROCODONE-ACETAMINOPHEN 5-325 MG PO TABS: 1.0000 | ORAL_TABLET | ORAL | Status: DC | PRN

## 2012-09-03 MED ORDER — LEVOFLOXACIN 500 MG PO TABS: 500.0000 mg | ORAL_TABLET | Freq: Every day | ORAL | Status: DC

## 2012-09-03 MED ORDER — ACETAMINOPHEN 325 MG PO TABS: 650.0000 mg | ORAL_TABLET | Freq: Four times a day (QID) | ORAL | Status: DC | PRN

## 2012-09-03 MED ORDER — DSS 100 MG PO CAPS: 100.0000 mg | ORAL_CAPSULE | Freq: Two times a day (BID) | ORAL | Status: DC

## 2012-09-03 MED ORDER — INSULIN GLARGINE 100 UNIT/ML ~~LOC~~ SOLN: 10.0000 [IU] | Freq: Every day | SUBCUTANEOUS | Status: DC

## 2012-09-03 MED ORDER — PANTOPRAZOLE SODIUM 40 MG PO TBEC: 40.0000 mg | DELAYED_RELEASE_TABLET | Freq: Every day | ORAL | Status: DC

## 2012-09-03 MED ORDER — INSULIN ASPART 100 UNIT/ML ~~LOC~~ SOLN: SUBCUTANEOUS | Status: DC

## 2012-09-03 NOTE — Progress Notes (Signed)
OT Cancellation Note  Patient Details Name: Carla Hartman MRN: [id number] DOB: 1921/09/18   Cancelled Treatment:     Pt declined OT tx x 2 today, stating that she does not feel up to it and that she doesn't have much of an appetite. Pt also stated that she just wanted to stay in the bed to sleep and rest today.  Margaretmary Eddy [facility] 09/03/2012, 1:07 PM

## 2014-04-05 ENCOUNTER — Emergency Department (HOSPITAL_COMMUNITY): Payer: Medicare Other

## 2014-04-05 ENCOUNTER — Emergency Department (HOSPITAL_COMMUNITY)
Admission: EM | Admit: 2014-04-05 | Discharge: 2014-04-05 | Disposition: A | Discharge status: Home / Self Care | Payer: Medicare Other | Attending: Emergency Medicine | Admitting: Emergency Medicine

## 2014-04-05 ENCOUNTER — Encounter (HOSPITAL_COMMUNITY): Payer: Self-pay | Admitting: Emergency Medicine

## 2014-04-05 DIAGNOSIS — F0391 Unspecified dementia with behavioral disturbance: Secondary | ICD-10-CM

## 2014-04-05 DIAGNOSIS — H409 Unspecified glaucoma: Secondary | ICD-10-CM | POA: Insufficient documentation

## 2014-04-05 DIAGNOSIS — F03918 Unspecified dementia, unspecified severity, with other behavioral disturbance: Secondary | ICD-10-CM | POA: Insufficient documentation

## 2014-04-05 DIAGNOSIS — Z8739 Personal history of other diseases of the musculoskeletal system and connective tissue: Secondary | ICD-10-CM | POA: Insufficient documentation

## 2014-04-05 DIAGNOSIS — Z87891 Personal history of nicotine dependence: Secondary | ICD-10-CM | POA: Diagnosis not present

## 2014-04-05 DIAGNOSIS — R4182 Altered mental status, unspecified: Secondary | ICD-10-CM | POA: Insufficient documentation

## 2014-04-05 DIAGNOSIS — E119 Type 2 diabetes mellitus without complications: Secondary | ICD-10-CM | POA: Insufficient documentation

## 2014-04-05 DIAGNOSIS — Z79899 Other long term (current) drug therapy: Secondary | ICD-10-CM | POA: Insufficient documentation

## 2014-04-05 DIAGNOSIS — Z880 Allergy status to penicillin: Secondary | ICD-10-CM | POA: Insufficient documentation

## 2014-04-05 LAB — CBC WITH DIFFERENTIAL/PLATELET
Basophils Absolute: 0 10*3/uL (ref 0.0–0.1)
Basophils Relative: 0 % (ref 0–1)
EOS ABS: 0.1 10*3/uL (ref 0.0–0.7)
Eosinophils Relative: 1 % (ref 0–5)
HEMATOCRIT: 42 % (ref 36.0–46.0)
HEMOGLOBIN: 14.5 g/dL (ref 12.0–15.0)
LYMPHS ABS: 2.2 10*3/uL (ref 0.7–4.0)
Lymphocytes Relative: 21 % (ref 12–46)
MCH: 31.2 pg (ref 26.0–34.0)
MCHC: 34.5 g/dL (ref 30.0–36.0)
MCV: 90.3 fL (ref 78.0–100.0)
MONO ABS: 0.7 10*3/uL (ref 0.1–1.0)
MONOS PCT: 7 % (ref 3–12)
NEUTROS PCT: 71 % (ref 43–77)
Neutro Abs: 7.3 10*3/uL (ref 1.7–7.7)
Platelets: 282 10*3/uL (ref 150–400)
RBC: 4.65 MIL/uL (ref 3.87–5.11)
RDW: 12.3 % (ref 11.5–15.5)
WBC: 10.4 10*3/uL (ref 4.0–10.5)

## 2014-04-05 LAB — COMPREHENSIVE METABOLIC PANEL
ALK PHOS: 57 U/L (ref 39–117)
ALT: 13 U/L (ref 0–35)
ANION GAP: 14 (ref 5–15)
AST: 14 U/L (ref 0–37)
Albumin: 3.4 g/dL — ABNORMAL LOW (ref 3.5–5.2)
BILIRUBIN TOTAL: 0.7 mg/dL (ref 0.3–1.2)
BUN: 15 mg/dL (ref 6–23)
CHLORIDE: 104 meq/L (ref 96–112)
CO2: 23 mEq/L (ref 19–32)
CREATININE: 0.57 mg/dL (ref 0.50–1.10)
Calcium: 9 mg/dL (ref 8.4–10.5)
GFR calc non Af Amer: 79 mL/min — ABNORMAL LOW (ref >90)
GLUCOSE: 144 mg/dL — AB (ref 70–99)
POTASSIUM: 3.5 meq/L — AB (ref 3.7–5.3)
Sodium: 141 mEq/L (ref 137–147)
TOTAL PROTEIN: 6.5 g/dL (ref 6.0–8.3)

## 2014-04-05 LAB — URINALYSIS, ROUTINE W REFLEX MICROSCOPIC
BILIRUBIN URINE: NEGATIVE
Glucose, UA: NEGATIVE mg/dL
Hgb urine dipstick: NEGATIVE
KETONES UR: 40 mg/dL — AB
Leukocytes, UA: NEGATIVE
NITRITE: NEGATIVE
PROTEIN: NEGATIVE mg/dL
Specific Gravity, Urine: 1.019 (ref 1.005–1.030)
UROBILINOGEN UA: 0.2 mg/dL (ref 0.0–1.0)
pH: 5.5 (ref 5.0–8.0)

## 2014-04-05 MED ORDER — LORAZEPAM 1 MG PO TABS: 1.0000 mg | ORAL_TABLET | Freq: Three times a day (TID) | ORAL | Status: AC | PRN

## 2014-04-05 MED ORDER — LORAZEPAM 2 MG/ML IJ SOLN
1.0000 mg | Freq: Once | INTRAMUSCULAR | Status: AC
  Administered 2014-04-05: 1 mg via INTRAVENOUS
  Filled 2014-04-05: qty 1

## 2014-04-05 NOTE — Discharge Instructions (Signed)

## 2014-04-05 NOTE — ED Notes (Addendum)
Pt from [facility] via [place] (address to facility is [phone]). Family wanted patient to be evaluated for a change in mental status over the past week. HX of Dementia. Patient alert and oriented per baseline (self, place, president, and month). PT denies pain and is unsure as to why she is here and thinks staff at the facility sent here because they don't like her. Pt has endorsed a major change with husband assisting her with care to  Him being to moved to a facility. She has not been eating or drinking well. She has had mood swing and periods of agitation.

## 2014-04-05 NOTE — ED Notes (Signed)
[facility] here to provide safe transport back to facility.   

## 2014-04-05 NOTE — ED Notes (Addendum)
Pt given meal tray, tolerating well at this time.  Pt becoming more agitated and restless, picking at monitor lines and IV.  Repetitive questions.  Family remains at bedside.  Awaiting dispo.  NAD.

## 2014-04-05 NOTE — ED Notes (Signed)
Bed: ZO10 Expected date: 04/05/14 Expected time: 9:30 AM Means of arrival: Ambulance Comments: Slow altered mental status over last week

## 2014-04-05 NOTE — Progress Notes (Addendum)
12:58pm. CSW met with pt and pt's daughter at bedside. Pt states she does not know why she was brought to the hospital and that she is waiting on the results of lab work. Pt was not able to tell this CSW where she lives. Pt in no acute distress at this time.  CSW met with daughter separately in consult room. Daughter is hcpoa. Pt's PCP is Kathryne Eriksson, MD from [facility]. Per daughter, pt (80 y/o f) has been living at [facility] (in [place]) since 2013 with her husband. Two weeks ago, pt's husband (73 y/o m) became ill and had to go to Skilled Nursing, where he is currently residing. Husband had been providing care for his wife. This past week, pt's daughter started providing care and hired a Theatre stage manager, Systems developer. Daughter became aware that pt's dementia was far worse than she had known. Per daughter's report, pt is displaying symptoms of paranoia (thinking her husband is cheating on her or trying to leave her), not remembering events that happened 30 minutes ago (not remembering someone had just visited her), wandering (trying to leave the house in the middle of the night) and agitation.  Daughter expressed interest in placement into an assisted living facility (ALF). CSW reviewed steps for ALF placement from community. [facility], where pt currently resides, has independent living, assisted living, and skilled nursing, and CSW stated that may be a good place to start looking.   2:30pm. CSW met with pt's daughter to review resources. CSW provided list of ALF's in [facility], psychoeducational materials on dementia, and document outlining process for pursuing ALF placement from the community. CSW provided supportive counseling and answered daughter's questions.   Rochele Pages,     ED CSW  phone: [phone]

## 2014-04-05 NOTE — ED Notes (Signed)
Pt transported to CT ?

## 2014-04-05 NOTE — ED Notes (Signed)
Social work at bedside for consult.

## 2014-04-05 NOTE — ED Notes (Signed)
Initial Contact - pt awake, alert, daughter at bedside.  Pt requesting something to eat and tray ordered at this time.  Skin PWD.  Speaking full/clear sentences, rr even/un-lab.  Self repositioning on stretcher.  NAD.

## 2014-04-05 NOTE — ED Provider Notes (Signed)
CSN: [id number]     Arrival date & time 04/05/14  9528 History   First MD Initiated Contact with Patient 04/05/14 [phone]     Chief Complaint  Patient presents with  . Altered Mental Status     (Consider location/radiation/quality/duration/timing/severity/associated sxs/prior Treatment) HPI Comments: Patient presents with change in mental status. She is a 80 year old female patient with history of dementia. She lives with her husband in an independent living area of the retirement community. Previously, the husband and taking care of her at home. Recently her husband was hospitalized with pneumonia. The patient's daughter and son have been here to visit and had noticed that her mental status is much worse than it has been in the past. She's been increasingly angry and pacing through the night. She's frequently trying to leave the house. In discussion with the patient's husband, the family has realized that this has actually been going on for a long period time but they're just seeing her behavior now. They have recently have hired a 24-hour caretaker however given her behavior, they are unable to control her at home.  Patient is a 80 y.o. female presenting with altered mental status.  Altered Mental Status   Past Medical History  Diagnosis Date  . Arthritis   . Diabetes mellitus without complication   . Glaucoma (increased eye pressure)    Past Surgical History  Procedure Laterality Date  . Thyroidectomy     Family History  Problem Relation Age of Onset  . Heart disease Mother   . Alcohol abuse Father    History  Substance Use Topics  . Smoking status: Former Games developer  . Smokeless tobacco: Not on file  . Alcohol Use: No   OB History   Grav Para Term Preterm Abortions TAB SAB Ect Mult Living                 Review of Systems  Unable to perform ROS: Dementia      Allergies  Aspirin; Codeine; Demerol; and Penicillins  Home Medications   Prior to Admission medications    Medication Sig Start Date End Date Taking? Authorizing Provider  glipiZIDE (GLUCOTROL) 5 MG tablet Take 5 mg by mouth daily before breakfast.   Yes Historical Provider, MD  levothyroxine (SYNTHROID, LEVOTHROID) 50 MCG tablet Take 50 mcg by mouth daily.   Yes Historical Provider, MD  lisinopril (PRINIVIL,ZESTRIL) 20 MG tablet Take 20 mg by mouth daily.   Yes Historical Provider, MD  metFORMIN (GLUCOPHAGE) 500 MG tablet Take 500 mg by mouth 2 (two) times daily with a meal.   Yes Historical Provider, MD  mirtazapine (REMERON) 15 MG tablet Take 15 mg by mouth at bedtime.   Yes Historical Provider, MD  LORazepam (ATIVAN) 1 MG tablet Take 1 tablet (1 mg total) by mouth every 8 (eight) hours as needed for anxiety. 04/05/14   Rolan Bucco, MD   BP 113/54  Pulse 82  Temp(Src) 98 F (36.7 C) (Oral)  Resp 24  SpO2 96% Physical Exam  Constitutional: She appears well-developed and well-nourished.  HENT:  Head: Normocephalic and atraumatic.  Eyes: Pupils are equal, round, and reactive to light.  Neck: Normal range of motion. Neck supple.  Cardiovascular: Normal rate, regular rhythm and normal heart sounds.   Pulmonary/Chest: Effort normal and breath sounds normal. No respiratory distress. She has no wheezes. She has no rales. She exhibits no tenderness.  Abdominal: Soft. Bowel sounds are normal. There is no tenderness. There is no rebound and no guarding.  Musculoskeletal: Normal range of motion. She exhibits no edema.  Lymphadenopathy:    She has no cervical adenopathy.  Neurological: She is alert.  Alert and answers questions, but confused.  Moves all extremities symmetrically.  Skin: Skin is warm and dry. No rash noted.  Psychiatric: She has a normal mood and affect.    ED Course  Procedures (including critical care time) Labs Review Labs Reviewed  COMPREHENSIVE METABOLIC PANEL - Abnormal; Notable for the following:    Potassium 3.5 (*)    Glucose, Bld 144 (*)    Albumin 3.4 (*)    GFR  calc non Af Amer 79 (*)    All other components within normal limits  URINALYSIS, ROUTINE W REFLEX MICROSCOPIC - Abnormal; Notable for the following:    Ketones, ur 40 (*)    All other components within normal limits  URINE CULTURE  CBC WITH DIFFERENTIAL    Imaging Review Dg Chest 2 View  04/05/2014   CLINICAL DATA:  Altered mental status. History of diabetes. Former smoker.  EXAM: CHEST  2 VIEW  COMPARISON:  None.  FINDINGS: Borderline enlarged cardiac silhouette. Normal mediastinal contours. The lungs appear hyperexpanded. No focal airspace opacities. No definite pleural effusion, though note, the left costophrenic angle is excluded from view. No pneumothorax. No evidence of edema. Surgical clips are seen within the mediastinum and right upper abdomen. Degenerative change of the right glenohumeral joint.  IMPRESSION: Hyperexpanded lungs without acute cardiopulmonary disease.   Electronically Signed   By: Simonne Come M.D.   On: 04/05/2014 10:19   Ct Head Wo Contrast  04/05/2014   CLINICAL DATA:  Altered mental status.  Patient angry.  EXAM: CT HEAD WITHOUT CONTRAST  TECHNIQUE: Contiguous axial images were obtained from the base of the skull through the vertex without intravenous contrast.  COMPARISON:  None.  FINDINGS: Global atrophy. Chronic ischemic change. No hemorrhage. No mass effect.  IMPRESSION: No acute disease.   Electronically Signed   By: Maryclare Bean M.D.   On: 04/05/2014 10:05     EKG Interpretation   Date/Time:  Sunday April 05 2014 10:11:11 EDT Ventricular Rate:  85 PR Interval:  255 QRS Duration: 131 QT Interval:  419 QTC Calculation: 498 R Axis:   -50 Text Interpretation:  Sinus rhythm Prolonged PR interval Left bundle  branch block No old tracing to compare Confirmed by Marco Raper  MD, Caydence Koenig  ([id number]) on 04/05/2014 10:33:27 AM      MDM   Final diagnoses:  Dementia, with behavioral disturbance    Patient with dementia and worsening behavior. It for any medical issues  leading to the change in mental status. I feel it's likely her ongoing dementia. I will consult social work to help with possible placement.  Social worker has seen the patient and consult with her daughter. She has given the daughter's step-by-step instructions for how to get the patient placed in a higher level nursing facility. The patient will be discharged home in the care of the family. They also have 24-hour home aide available. She was given a dose of Ativan here in the ED due to view agitation. This does seem to help with her agitation. I discussed this with the daughter and she is requesting that we give her prescription for Ativan to use for the next few days until he can try to get her into a facility. Her daughter will contact the patient's physician on Tuesday to help with nursing home placement.  Rolan Bucco, MD 04/05/14 [phone]

## 2014-04-06 LAB — URINE CULTURE
Colony Count: NO GROWTH
Culture: NO GROWTH

## 2014-04-08 ENCOUNTER — Emergency Department (HOSPITAL_COMMUNITY)
Admission: EM | Admit: 2014-04-08 | Discharge: 2014-04-08 | Disposition: A | Discharge status: Home / Self Care | Payer: Medicare Other | Attending: Emergency Medicine | Admitting: Emergency Medicine

## 2014-04-08 ENCOUNTER — Emergency Department (HOSPITAL_COMMUNITY): Payer: Medicare Other

## 2014-04-08 ENCOUNTER — Encounter (HOSPITAL_COMMUNITY): Payer: Self-pay | Admitting: Emergency Medicine

## 2014-04-08 DIAGNOSIS — Z79899 Other long term (current) drug therapy: Secondary | ICD-10-CM | POA: Insufficient documentation

## 2014-04-08 DIAGNOSIS — W19XXXA Unspecified fall, initial encounter: Secondary | ICD-10-CM | POA: Diagnosis not present

## 2014-04-08 DIAGNOSIS — F0390 Unspecified dementia without behavioral disturbance: Secondary | ICD-10-CM | POA: Insufficient documentation

## 2014-04-08 DIAGNOSIS — Z880 Allergy status to penicillin: Secondary | ICD-10-CM | POA: Insufficient documentation

## 2014-04-08 DIAGNOSIS — Z043 Encounter for examination and observation following other accident: Secondary | ICD-10-CM | POA: Diagnosis not present

## 2014-04-08 DIAGNOSIS — R5381 Other malaise: Secondary | ICD-10-CM | POA: Diagnosis not present

## 2014-04-08 DIAGNOSIS — R42 Dizziness and giddiness: Secondary | ICD-10-CM | POA: Diagnosis not present

## 2014-04-08 DIAGNOSIS — Y9389 Activity, other specified: Secondary | ICD-10-CM | POA: Diagnosis not present

## 2014-04-08 DIAGNOSIS — Z87891 Personal history of nicotine dependence: Secondary | ICD-10-CM | POA: Insufficient documentation

## 2014-04-08 DIAGNOSIS — Z8739 Personal history of other diseases of the musculoskeletal system and connective tissue: Secondary | ICD-10-CM | POA: Diagnosis not present

## 2014-04-08 DIAGNOSIS — E119 Type 2 diabetes mellitus without complications: Secondary | ICD-10-CM | POA: Diagnosis not present

## 2014-04-08 DIAGNOSIS — Z8669 Personal history of other diseases of the nervous system and sense organs: Secondary | ICD-10-CM | POA: Insufficient documentation

## 2014-04-08 DIAGNOSIS — Y9210 Unspecified residential institution as the place of occurrence of the external cause: Secondary | ICD-10-CM | POA: Diagnosis not present

## 2014-04-08 DIAGNOSIS — R531 Weakness: Secondary | ICD-10-CM

## 2014-04-08 DIAGNOSIS — R5383 Other fatigue: Principal | ICD-10-CM

## 2014-04-08 LAB — BASIC METABOLIC PANEL
Anion gap: 11 (ref 5–15)
BUN: 10 mg/dL (ref 6–23)
CHLORIDE: 104 meq/L (ref 96–112)
CO2: 26 meq/L (ref 19–32)
Calcium: 9.3 mg/dL (ref 8.4–10.5)
Creatinine, Ser: 0.51 mg/dL (ref 0.50–1.10)
GFR calc non Af Amer: 82 mL/min — ABNORMAL LOW (ref >90)
GLUCOSE: 165 mg/dL — AB (ref 70–99)
POTASSIUM: 3.5 meq/L — AB (ref 3.7–5.3)
Sodium: 141 mEq/L (ref 137–147)

## 2014-04-08 LAB — URINALYSIS, ROUTINE W REFLEX MICROSCOPIC
Bilirubin Urine: NEGATIVE
Glucose, UA: NEGATIVE mg/dL
HGB URINE DIPSTICK: NEGATIVE
Ketones, ur: 15 mg/dL — AB
Leukocytes, UA: NEGATIVE
Nitrite: NEGATIVE
Protein, ur: NEGATIVE mg/dL
SPECIFIC GRAVITY, URINE: 1.008 (ref 1.005–1.030)
UROBILINOGEN UA: 0.2 mg/dL (ref 0.0–1.0)
pH: 6.5 (ref 5.0–8.0)

## 2014-04-08 LAB — CBC
HEMATOCRIT: 43 % (ref 36.0–46.0)
HEMOGLOBIN: 14.8 g/dL (ref 12.0–15.0)
MCH: 30.9 pg (ref 26.0–34.0)
MCHC: 34.4 g/dL (ref 30.0–36.0)
MCV: 89.8 fL (ref 78.0–100.0)
Platelets: 276 10*3/uL (ref 150–400)
RBC: 4.79 MIL/uL (ref 3.87–5.11)
RDW: 12.2 % (ref 11.5–15.5)
WBC: 7.8 10*3/uL (ref 4.0–10.5)

## 2014-04-08 MED ORDER — ACETAMINOPHEN 325 MG PO TABS
650.0000 mg | ORAL_TABLET | Freq: Once | ORAL | Status: AC
  Administered 2014-04-08: 650 mg via ORAL
  Filled 2014-04-08: qty 2

## 2014-04-08 NOTE — ED Notes (Signed)
Patient transported to X-ray 

## 2014-04-08 NOTE — ED Notes (Signed)
Bed: ZO10 Expected date:  Expected time:  Means of arrival:  Comments: EMS 10-1

## 2014-04-08 NOTE — Discharge Instructions (Signed)
Weakness °Weakness is a lack of strength. It may be felt all over the body (generalized) or in one specific part of the body (focal). Some causes of weakness can be serious. You may need further medical evaluation, especially if you are elderly or you have a history of immunosuppression (such as chemotherapy or HIV), kidney disease, heart disease, or diabetes. °CAUSES  °Weakness can be caused by many different things, including: °· Infection. °· Physical exhaustion. °· Internal bleeding or other blood loss that results in a lack of red blood cells (anemia). °· Dehydration. This cause is more common in elderly people. °· Side effects or electrolyte abnormalities from medicines, such as pain medicines or sedatives. °· Emotional distress, anxiety, or depression. °· Circulation problems, especially severe peripheral arterial disease. °· Heart disease, such as rapid atrial fibrillation, bradycardia, or heart failure. °· Nervous system disorders, such as Guillain-Barré syndrome, multiple sclerosis, or stroke. °DIAGNOSIS  °To find the cause of your weakness, your caregiver will take your history and perform a physical exam. Lab tests or X-rays may also be ordered, if needed. °TREATMENT  °Treatment of weakness depends on the cause of your symptoms and can vary greatly. °HOME CARE INSTRUCTIONS  °· Rest as needed. °· Eat a well-balanced diet. °· Try to get some exercise every day. °· Only take over-the-counter or prescription medicines as directed by your caregiver. °SEEK MEDICAL CARE IF:  °· Your weakness seems to be getting worse or spreads to other parts of your body. °· You develop new aches or pains. °SEEK IMMEDIATE MEDICAL CARE IF:  °· You cannot perform your normal daily activities, such as getting dressed and feeding yourself. °· You cannot walk up and down stairs, or you feel exhausted when you do so. °· You have shortness of breath or chest pain. °· You have difficulty moving parts of your body. °· You have weakness  in only one area of the body or on only one side of the body. °· You have a fever. °· You have trouble speaking or swallowing. °· You cannot control your bladder or bowel movements. °· You have black or bloody vomit or stools. °MAKE SURE YOU: °· Understand these instructions. °· Will watch your condition. °· Will get help right away if you are not doing well or get worse. °Document Released: 07/17/2005 Document Revised: 01/16/2012 Document Reviewed: 09/15/2011 °ExitCare® Patient Information ©2015 [employer]. This information is not intended to replace advice given to you by your health care provider. Make sure you discuss any questions you have with your health care provider. ° °Near-Syncope °Near-syncope (commonly known as near fainting) is sudden weakness, dizziness, or feeling like you might pass out. During an episode of near-syncope, you may also develop pale skin, have tunnel vision, or feel sick to your stomach (nauseous). Near-syncope may occur when getting up after sitting or while standing for a long time. It is caused by a sudden decrease in blood flow to the brain. This decrease can result from various causes or triggers, most of which are not serious. However, because near-syncope can sometimes be a sign of something serious, a medical evaluation is required. The specific cause is often not determined. °HOME CARE INSTRUCTIONS  °Monitor your condition for any changes. The following actions may help to alleviate any discomfort you are experiencing: °· Have someone stay with you until you feel stable. °· Lie down right away and prop your feet up if you start feeling like you might faint. Breathe deeply and steadily. Wait   until all the symptoms have passed. Most of these episodes last only a few minutes. You may feel tired for several hours.   °· Drink enough fluids to keep your urine clear or pale yellow.   °· If you are taking blood pressure or heart medicine, get up slowly when seated or lying down.  Take several minutes to sit and then stand. This can reduce dizziness. °· Follow up with your health care provider as directed.  °SEEK IMMEDIATE MEDICAL CARE IF:  °· You have a severe headache.   °· You have unusual pain in the chest, abdomen, or back.   °· You are bleeding from the mouth or rectum, or you have black or tarry stool.   °· You have an irregular or very fast heartbeat.   °· You have repeated fainting or have seizure-like jerking during an episode.   °· You faint when sitting or lying down.   °· You have confusion.   °· You have difficulty walking.   °· You have severe weakness.   °· You have vision problems.   °MAKE SURE YOU:  °· Understand these instructions. °· Will watch your condition. °· Will get help right away if you are not doing well or get worse. °Document Released: 07/17/2005 Document Revised: 07/22/2013 Document Reviewed: 12/20/2012 °ExitCare® Patient Information ©2015 [employer]. This information is not intended to replace advice given to you by your health care provider. Make sure you discuss any questions you have with your health care provider. ° °

## 2014-04-08 NOTE — ED Notes (Signed)
Per EMS pt coming from [facility] nursing home in [place] with c/o fall this morning. Pt also reports feeling weak since they started her on lorazepam (  q8hr-per EMS). Per EMS no injuries.

## 2014-04-08 NOTE — ED Provider Notes (Signed)
CSN: [id number]     Arrival date & time 04/08/14  1011 History   First MD Initiated Contact with Patient 04/08/14 1013     Chief Complaint  Patient presents with  . Fall  . Weakness     (Consider location/radiation/quality/duration/timing/severity/associated sxs/prior Treatment) HPI 80 year old female presents by EMS after nearly falling today. The history taken from EMS as the patient is demented. According to them the nursing home reports that the patient felt weak and nearly fell. She did not hit her head or injure herself. She did not lose consciousness and was stating that she was lightheaded. A couple days ago she started on Ativan 1 mg Q8 for agitation. No other new medicines. The patient currently does not have any complaints when I'm talking to her. I tried to call the [facility] but was unable to be connected to people that take care of her or witnessed what happened today.   Past Medical History  Diagnosis Date  . Arthritis   . Diabetes mellitus without complication   . Glaucoma (increased eye pressure)    Past Surgical History  Procedure Laterality Date  . Thyroidectomy     Family History  Problem Relation Age of Onset  . Heart disease Mother   . Alcohol abuse Father    History  Substance Use Topics  . Smoking status: Former Games developer  . Smokeless tobacco: Not on file  . Alcohol Use: No   OB History   Grav Para Term Preterm Abortions TAB SAB Ect Mult Living                 Review of Systems  Unable to perform ROS: Dementia      Allergies  Aspirin; Codeine; Demerol; and Penicillins  Home Medications   Prior to Admission medications   Medication Sig Start Date End Date Taking? Authorizing Provider  glipiZIDE (GLUCOTROL) 5 MG tablet Take 5 mg by mouth daily before breakfast.    Historical Provider, MD  levothyroxine (SYNTHROID, LEVOTHROID) 50 MCG tablet Take 50 mcg by mouth daily.    Historical Provider, MD  lisinopril  (PRINIVIL,ZESTRIL) 20 MG tablet Take 20 mg by mouth daily.    Historical Provider, MD  LORazepam (ATIVAN) 1 MG tablet Take 1 tablet (1 mg total) by mouth every 8 (eight) hours as needed for anxiety. 04/05/14   Rolan Bucco, MD  metFORMIN (GLUCOPHAGE) 500 MG tablet Take 500 mg by mouth 2 (two) times daily with a meal.    Historical Provider, MD  mirtazapine (REMERON) 15 MG tablet Take 15 mg by mouth at bedtime.    Historical Provider, MD   SpO2 98% Physical Exam  Nursing note and vitals reviewed. Constitutional: She appears well-developed and well-nourished.  No signs of trauma  HENT:  Head: Normocephalic and atraumatic.  Right Ear: External ear normal.  Left Ear: External ear normal.  Nose: Nose normal.  Mouth/Throat: Oropharynx is clear and moist.  Eyes: EOM are normal. Pupils are equal, round, and reactive to light. Right eye exhibits no discharge. Left eye exhibits no discharge.  Cardiovascular: Normal rate, regular rhythm and normal heart sounds.   Pulmonary/Chest: Effort normal and breath sounds normal.  Abdominal: Soft. She exhibits no distension. There is no tenderness.  Musculoskeletal:       Right hip: She exhibits normal range of motion and no tenderness.       Left hip: She exhibits normal range of motion and no tenderness.  Right knee: Tenderness (mild pain with ROM) found.       Left knee: She exhibits normal range of motion. No tenderness found.       Right ankle: No tenderness.       Right foot: She exhibits tenderness.  Neurological: She is alert. She is disoriented. GCS eye subscore is 4. GCS verbal subscore is 4. GCS motor subscore is 6.  CN 2-12 grossly intact. Equal strength in all 4 extremities  Skin: Skin is warm and dry.    ED Course  Procedures (including critical care time) Labs Review Labs Reviewed  BASIC METABOLIC PANEL - Abnormal; Notable for the following:    Potassium 3.5 (*)    Glucose, Bld 165 (*)    GFR calc non Af Amer 82 (*)    All other  components within normal limits  URINALYSIS, ROUTINE W REFLEX MICROSCOPIC - Abnormal; Notable for the following:    Ketones, ur 15 (*)    All other components within normal limits  CBC    Imaging Review Dg Knee Complete 4 Views Right  04/08/2014   CLINICAL DATA:  Right knee pain.  EXAM: RIGHT KNEE - COMPLETE 4+ VIEW  COMPARISON:  None.  FINDINGS: The bones appear osteopenic. There is mild to moderate medial compartment joint space narrowing. There is mild to moderate medial compartment marginal osteophytosis, with mild lateral compartment and patellofemoral osteophytosis noted as well. No acute fracture or dislocation is seen. There is a small knee joint effusion. Small superior patellar enthesophyte is noted. Scattered vascular calcifications are noted.  IMPRESSION: 1. No acute osseous abnormality. 2. Mild to moderate tricompartmental osteoarthrosis, greatest in the medial compartment.   Electronically Signed   By: Sebastian Ache   On: 04/08/2014 13:15   Dg Foot Complete Right  04/08/2014   CLINICAL DATA:  Fall.  Pain.  EXAM: RIGHT FOOT COMPLETE - 3+ VIEW  COMPARISON:  None.  FINDINGS: Diffuse mild soft tissue swelling is present. Diffuse severe osteopenia is present. Osteopenia is out most likely with age-related as opposed to a process such as reflex sympathetic dystrophy . No evidence of fracture or dislocation. Peripheral vascular calcification noted consistent with peripheral vascular disease.  IMPRESSION: 1. No acute bony abnormality. 2. Diffuse soft tissue swelling and severe osteopenia. 3. Peripheral vascular disease.   Electronically Signed   By: Maisie Fus  Register   On: 04/08/2014 14:28     EKG Interpretation   Date/Time:  Wednesday April 08 2014 10:51:36 EDT Ventricular Rate:  76 PR Interval:  230 QRS Duration: 134 QT Interval:  436 QTC Calculation: 490 R Axis:   -56 Text Interpretation:  Sinus rhythm Prolonged PR interval Left bundle  branch block No significant change since last  tracing Confirmed by  Grey Schlauch  MD, Felder Lebeda ([id number]) on 04/08/2014 10:56:22 AM      MDM   Final diagnoses:  Weakness    Patient's daughter arrived after workup initiated. She states that patient has seemed weaker after being on Ativan. She started to have her Ativan cut in half by her PCP at the facility. She also relates that the husband told her that the patient seems to hold her breath when in the bathroom. This is been an ongoing issue. Multiple times she has nearly passed out or felt weak after going to the bathroom. The daughter is unsure this is what happened today but the patient did feel weak afterward going to the bathroom today. Xrays benign. Given hx of breath holding and new ativan,  these seem like most likely causes of her weakness episode today. After discussion with daughter, will d/c back to nursing home for medication management by PCP.    Audree Camel, MD 04/08/14 [phone]

## 2014-04-08 NOTE — ED Notes (Signed)
[facility] called to transport [facility]

## 2014-04-09 ENCOUNTER — Emergency Department (HOSPITAL_COMMUNITY)
Admission: EM | Admit: 2014-04-09 | Discharge: 2014-04-10 | Disposition: A | Discharge status: Home / Self Care | Payer: Medicare Other | Attending: Emergency Medicine | Admitting: Emergency Medicine

## 2014-04-09 ENCOUNTER — Encounter (HOSPITAL_COMMUNITY): Payer: Self-pay | Admitting: Emergency Medicine

## 2014-04-09 ENCOUNTER — Emergency Department (HOSPITAL_COMMUNITY): Payer: Medicare Other

## 2014-04-09 DIAGNOSIS — F0391 Unspecified dementia with behavioral disturbance: Secondary | ICD-10-CM

## 2014-04-09 DIAGNOSIS — R110 Nausea: Secondary | ICD-10-CM | POA: Diagnosis not present

## 2014-04-09 DIAGNOSIS — F29 Unspecified psychosis not due to a substance or known physiological condition: Secondary | ICD-10-CM | POA: Insufficient documentation

## 2014-04-09 DIAGNOSIS — Z87891 Personal history of nicotine dependence: Secondary | ICD-10-CM | POA: Insufficient documentation

## 2014-04-09 DIAGNOSIS — E119 Type 2 diabetes mellitus without complications: Secondary | ICD-10-CM | POA: Diagnosis not present

## 2014-04-09 DIAGNOSIS — R079 Chest pain, unspecified: Secondary | ICD-10-CM | POA: Insufficient documentation

## 2014-04-09 DIAGNOSIS — Z79899 Other long term (current) drug therapy: Secondary | ICD-10-CM | POA: Diagnosis not present

## 2014-04-09 DIAGNOSIS — Z880 Allergy status to penicillin: Secondary | ICD-10-CM | POA: Diagnosis not present

## 2014-04-09 DIAGNOSIS — Z8739 Personal history of other diseases of the musculoskeletal system and connective tissue: Secondary | ICD-10-CM | POA: Diagnosis not present

## 2014-04-09 DIAGNOSIS — R0789 Other chest pain: Secondary | ICD-10-CM

## 2014-04-09 DIAGNOSIS — F03918 Unspecified dementia, unspecified severity, with other behavioral disturbance: Secondary | ICD-10-CM | POA: Insufficient documentation

## 2014-04-09 DIAGNOSIS — Z8669 Personal history of other diseases of the nervous system and sense organs: Secondary | ICD-10-CM | POA: Insufficient documentation

## 2014-04-09 LAB — CBC WITH DIFFERENTIAL/PLATELET
BASOS ABS: 0 10*3/uL (ref 0.0–0.1)
Basophils Relative: 1 % (ref 0–1)
EOS ABS: 0.3 10*3/uL (ref 0.0–0.7)
EOS PCT: 4 % (ref 0–5)
HCT: 41.1 % (ref 36.0–46.0)
Hemoglobin: 14.4 g/dL (ref 12.0–15.0)
Lymphocytes Relative: 26 % (ref 12–46)
Lymphs Abs: 2.1 10*3/uL (ref 0.7–4.0)
MCH: 31.4 pg (ref 26.0–34.0)
MCHC: 35 g/dL (ref 30.0–36.0)
MCV: 89.5 fL (ref 78.0–100.0)
Monocytes Absolute: 0.7 10*3/uL (ref 0.1–1.0)
Monocytes Relative: 8 % (ref 3–12)
Neutro Abs: 5 10*3/uL (ref 1.7–7.7)
Neutrophils Relative %: 61 % (ref 43–77)
PLATELETS: 255 10*3/uL (ref 150–400)
RBC: 4.59 MIL/uL (ref 3.87–5.11)
RDW: 12.3 % (ref 11.5–15.5)
WBC: 8 10*3/uL (ref 4.0–10.5)

## 2014-04-09 LAB — BASIC METABOLIC PANEL
ANION GAP: 14 (ref 5–15)
BUN: 10 mg/dL (ref 6–23)
CALCIUM: 8.6 mg/dL (ref 8.4–10.5)
CO2: 22 mEq/L (ref 19–32)
Chloride: 105 mEq/L (ref 96–112)
Creatinine, Ser: 0.44 mg/dL — ABNORMAL LOW (ref 0.50–1.10)
GFR calc Af Amer: >90 mL/min (ref >90)
GFR calc non Af Amer: 86 mL/min — ABNORMAL LOW (ref >90)
Glucose, Bld: 175 mg/dL — ABNORMAL HIGH (ref 70–99)
Potassium: 4.1 mEq/L (ref 3.7–5.3)
SODIUM: 141 meq/L (ref 137–147)

## 2014-04-09 LAB — I-STAT TROPONIN, ED: TROPONIN I, POC: 0.01 ng/mL (ref 0.00–0.08)

## 2014-04-09 NOTE — ED Notes (Signed)
Per EMS, pt. Here for intermittent chest pain this evening. States had episode of nausea prior to chest pain starting. Given 324 ASA and 1 Nitro by EMS and pain resolved. Per EMS, patient has LBBB with no history. Pt. Denies complaints on arrival.

## 2014-04-09 NOTE — ED Notes (Signed)
Patient stays at [facility] Patient contact: [facility] [phone] Cell

## 2014-04-09 NOTE — ED Notes (Signed)
Pt denies chest pain at this pain.

## 2014-04-09 NOTE — ED Provider Notes (Addendum)
CSN: [id number]     Arrival date & time 04/09/14  2218 History   First MD Initiated Contact with Patient 04/09/14 2301     Chief Complaint  Patient presents with  . Chest Pain     (Consider location/radiation/quality/duration/timing/severity/associated sxs/prior Treatment) HPI Patient has a history of dementia and is a difficult historian. The patient's daughter is at the bedside. She states the patient is in her normal mental status. She recently was started on Ativan and Remeron for her frequent bouts of agitation. Since that time she's been much more calm. She is in the emergency department yesterday with left leg pain and generalized weakness. This evening the patient began having lower chest pain that did not radiate. She is unable to describe the pain. Pain started roughly at 9 PM. Caretaker called EMS. Was given 324 mg of aspirin and one nitroglycerin in route. Patient states that she no longer has any pain. She denies any shortness of breath. She has no nausea or vomiting. She's had no lower extremity swelling or pain today. Past Medical History  Diagnosis Date  . Arthritis   . Diabetes mellitus without complication   . Glaucoma (increased eye pressure)    Past Surgical History  Procedure Laterality Date  . Thyroidectomy     Family History  Problem Relation Age of Onset  . Heart disease Mother   . Alcohol abuse Father    History  Substance Use Topics  . Smoking status: Former Games developer  . Smokeless tobacco: Not on file  . Alcohol Use: No   OB History   Grav Para Term Preterm Abortions TAB SAB Ect Mult Living                 Review of Systems  Unable to perform ROS: Dementia  Constitutional: Negative for fever and chills.  Cardiovascular: Positive for chest pain. Negative for leg swelling.  Gastrointestinal: Positive for nausea. Negative for vomiting and abdominal pain.  Skin: Negative for rash.  Psychiatric/Behavioral: Positive for confusion.      Allergies   Aspirin; Codeine; Demerol; and Penicillins  Home Medications   Prior to Admission medications   Medication Sig Start Date End Date Taking? Authorizing Provider  cholecalciferol (VITAMIN D) 1000 UNITS tablet Take 1,000 Units by mouth daily.   Yes Historical Provider, MD  glipiZIDE (GLUCOTROL) 5 MG tablet Take 5 mg by mouth daily before breakfast.   Yes Historical Provider, MD  levothyroxine (SYNTHROID, LEVOTHROID) 50 MCG tablet Take 50 mcg by mouth daily.   Yes Historical Provider, MD  lisinopril (PRINIVIL,ZESTRIL) 20 MG tablet Take 20 mg by mouth daily.   Yes Historical Provider, MD  LORazepam (ATIVAN) 1 MG tablet Take 1 tablet (1 mg total) by mouth every 8 (eight) hours as needed for anxiety. 04/05/14  Yes Rolan Bucco, MD  Lutein 20 MG CAPS Take 1 capsule by mouth daily.   Yes Historical Provider, MD  Lycopene 10 MG CAPS Take 1 capsule by mouth daily.   Yes Historical Provider, MD  metFORMIN (GLUCOPHAGE) 500 MG tablet Take 500 mg by mouth 2 (two) times daily with a meal.    Yes Historical Provider, MD  mirtazapine (REMERON) 15 MG tablet Take 15 mg by mouth at bedtime.   Yes Historical Provider, MD  Multiple Vitamin (MULTIVITAMIN WITH MINERALS) TABS tablet Take 1 tablet by mouth daily.   Yes Historical Provider, MD  Omega 3 1000 MG CAPS Take 1 capsule by mouth daily.   Yes Historical Provider, MD  vitamin  C (ASCORBIC ACID) 500 MG tablet Take 500 mg by mouth daily.   Yes Historical Provider, MD   BP 125/58  Pulse 75  Resp 16  SpO2 97% Physical Exam  Nursing note and vitals reviewed. Constitutional: She appears well-developed and well-nourished. No distress.  Pleasantly confused  HENT:  Head: Normocephalic and atraumatic.  Mouth/Throat: Oropharynx is clear and moist. No oropharyngeal exudate.  Eyes: EOM are normal. Pupils are equal, round, and reactive to light.  Neck: Normal range of motion. Neck supple.  No meningismus. No posterior neck tenderness with palpation.  Cardiovascular:  Normal rate and regular rhythm.   Pulmonary/Chest: Effort normal and breath sounds normal. No respiratory distress. She has no wheezes. She has no rales. She exhibits no tenderness.  Abdominal: Soft. Bowel sounds are normal. She exhibits no distension and no mass. There is tenderness (mild epigastric tenderness with palpation.). There is no rebound and no guarding.  Musculoskeletal: Normal range of motion. She exhibits no edema and no tenderness.  No calf swelling or tenderness.  Neurological: She is alert.  Oriented to self. Moves all extremities without deficit. Sensation is grossly intact.  Skin: Skin is warm and dry. No rash noted. No erythema.  Psychiatric: She has a normal mood and affect. Her behavior is normal.    ED Course  Procedures (including critical care time) Labs Review Labs Reviewed  BASIC METABOLIC PANEL - Abnormal; Notable for the following:    Glucose, Bld 175 (*)    Creatinine, Ser 0.44 (*)    GFR calc non Af Amer 86 (*)    All other components within normal limits  CBC WITH DIFFERENTIAL  URINALYSIS, ROUTINE W REFLEX MICROSCOPIC  I-STAT TROPOININ, ED    Imaging Review Dg Chest 2 View  04/09/2014   CLINICAL DATA:  Chest and epigastric pain.  EXAM: CHEST  2 VIEW  COMPARISON:  04/05/2014  FINDINGS: Normal heart size and pulmonary vascularity. Diffuse emphysematous changes throughout the lungs with scattered central fibrosis consistent with chronic bronchitis. Calcified and tortuous aorta. No focal airspace disease or consolidation in the lungs. No blunting of costophrenic angles. No pneumothorax. Degenerative changes in the spine. Surgical clips in the right lower chest. Degenerative changes in the shoulders. Old right rib fractures.  IMPRESSION: Emphysematous and chronic bronchitic changes in the lungs. No evidence of active pulmonary disease.   Electronically Signed   By: Burman Nieves M.D.   On: 04/09/2014 23:47   Dg Knee Complete 4 Views Right  04/08/2014    CLINICAL DATA:  Right knee pain.  EXAM: RIGHT KNEE - COMPLETE 4+ VIEW  COMPARISON:  None.  FINDINGS: The bones appear osteopenic. There is mild to moderate medial compartment joint space narrowing. There is mild to moderate medial compartment marginal osteophytosis, with mild lateral compartment and patellofemoral osteophytosis noted as well. No acute fracture or dislocation is seen. There is a small knee joint effusion. Small superior patellar enthesophyte is noted. Scattered vascular calcifications are noted.  IMPRESSION: 1. No acute osseous abnormality. 2. Mild to moderate tricompartmental osteoarthrosis, greatest in the medial compartment.   Electronically Signed   By: Sebastian Ache   On: 04/08/2014 13:15   Dg Foot Complete Right  04/08/2014   CLINICAL DATA:  Fall.  Pain.  EXAM: RIGHT FOOT COMPLETE - 3+ VIEW  COMPARISON:  None.  FINDINGS: Diffuse mild soft tissue swelling is present. Diffuse severe osteopenia is present. Osteopenia is out most likely with age-related as opposed to a process such as reflex sympathetic dystrophy .  No evidence of fracture or dislocation. Peripheral vascular calcification noted consistent with peripheral vascular disease.  IMPRESSION: 1. No acute bony abnormality. 2. Diffuse soft tissue swelling and severe osteopenia. 3. Peripheral vascular disease.   Electronically Signed   By: Maisie Fus  Register   On: 04/08/2014 14:28     EKG Interpretation   Date/Time:  Thursday April 09 2014 22:23:25 EDT Ventricular Rate:  78 PR Interval:  208 QRS Duration: 133 QT Interval:  427 QTC Calculation: 486 R Axis:   -42 Text Interpretation:  Sinus rhythm Left bundle branch block No significant  change since last tracing Confirmed by WARD,  DO, KRISTEN ([id number]) on  04/09/2014 10:28:43 PM      MDM   Final diagnoses:  None      Patient remained chest pain-free. She has negative troponins x2. EKG without any acute changes. She has a CT and do chest without any evidence of PE.  The patient's been well screen for acute life-threatening event. We'll discharge the patient home to followup with her primary Dr. is an outpatient. Return precautions have been given to the patient and to her daughter.  Loren Racer, MD 04/10/14 6045  Loren Racer, MD 04/10/14 [phone]

## 2014-04-10 ENCOUNTER — Emergency Department (HOSPITAL_COMMUNITY): Payer: Medicare Other

## 2014-04-10 ENCOUNTER — Encounter (HOSPITAL_COMMUNITY): Payer: Self-pay

## 2014-04-10 LAB — HEPATIC FUNCTION PANEL
ALT: 13 U/L (ref 0–35)
AST: 14 U/L (ref 0–37)
Albumin: 2.9 g/dL — ABNORMAL LOW (ref 3.5–5.2)
Alkaline Phosphatase: 57 U/L (ref 39–117)
BILIRUBIN TOTAL: 0.4 mg/dL (ref 0.3–1.2)
Bilirubin, Direct: <0.2 mg/dL (ref 0.0–0.3)
Total Protein: 5.9 g/dL — ABNORMAL LOW (ref 6.0–8.3)

## 2014-04-10 LAB — URINALYSIS, ROUTINE W REFLEX MICROSCOPIC
Bilirubin Urine: NEGATIVE
GLUCOSE, UA: NEGATIVE mg/dL
Hgb urine dipstick: NEGATIVE
Ketones, ur: NEGATIVE mg/dL
Leukocytes, UA: NEGATIVE
Nitrite: NEGATIVE
PROTEIN: NEGATIVE mg/dL
Specific Gravity, Urine: 1.031 — ABNORMAL HIGH (ref 1.005–1.030)
UROBILINOGEN UA: 1 mg/dL (ref 0.0–1.0)
pH: 7 (ref 5.0–8.0)

## 2014-04-10 LAB — I-STAT TROPONIN, ED: Troponin i, poc: 0.01 ng/mL (ref 0.00–0.08)

## 2014-04-10 LAB — D-DIMER, QUANTITATIVE: D-Dimer, Quant: 0.64 ug/mL-FEU — ABNORMAL HIGH (ref 0.00–0.48)

## 2014-04-10 LAB — LIPASE, BLOOD: LIPASE: 25 U/L (ref 11–59)

## 2014-04-10 MED ORDER — IOHEXOL 350 MG/ML SOLN
80.0000 mL | Freq: Once | INTRAVENOUS | Status: AC | PRN
  Administered 2014-04-10: 80 mL via INTRAVENOUS

## 2014-04-10 NOTE — ED Notes (Signed)
[facility] called to transport patient home 

## 2014-04-10 NOTE — ED Notes (Signed)
Patient was incont.

## 2014-04-10 NOTE — ED Notes (Signed)
Transported to CT 

## 2015-08-11 IMAGING — CR DG CHEST 2V
2 series · 2 of 2 positions shown · non-contrast
Comparison: None.

CLINICAL DATA: Altered mental status. History of diabetes. Former
smoker.

EXAM:
CHEST  2 VIEW

[w chest lat]
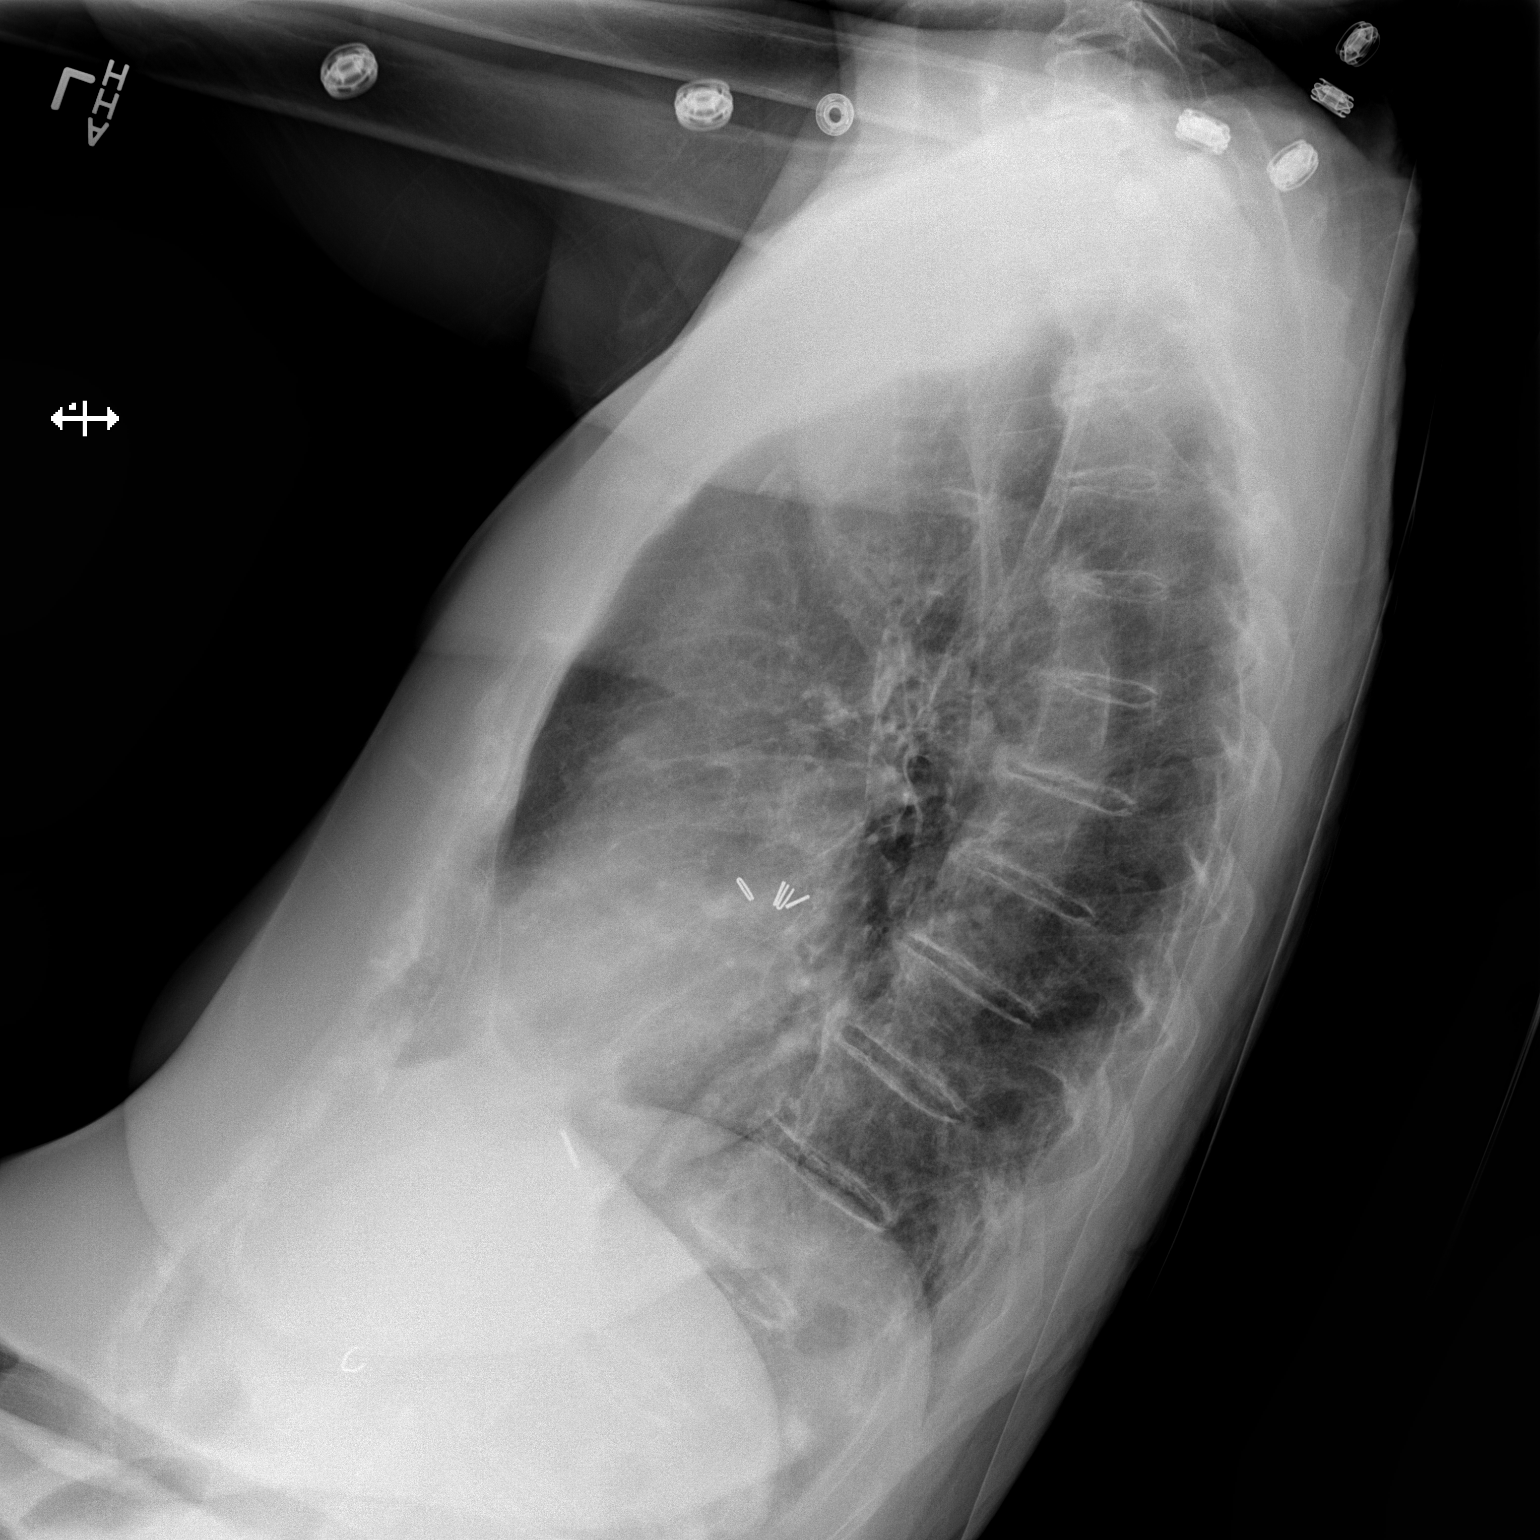

[x chest ap]
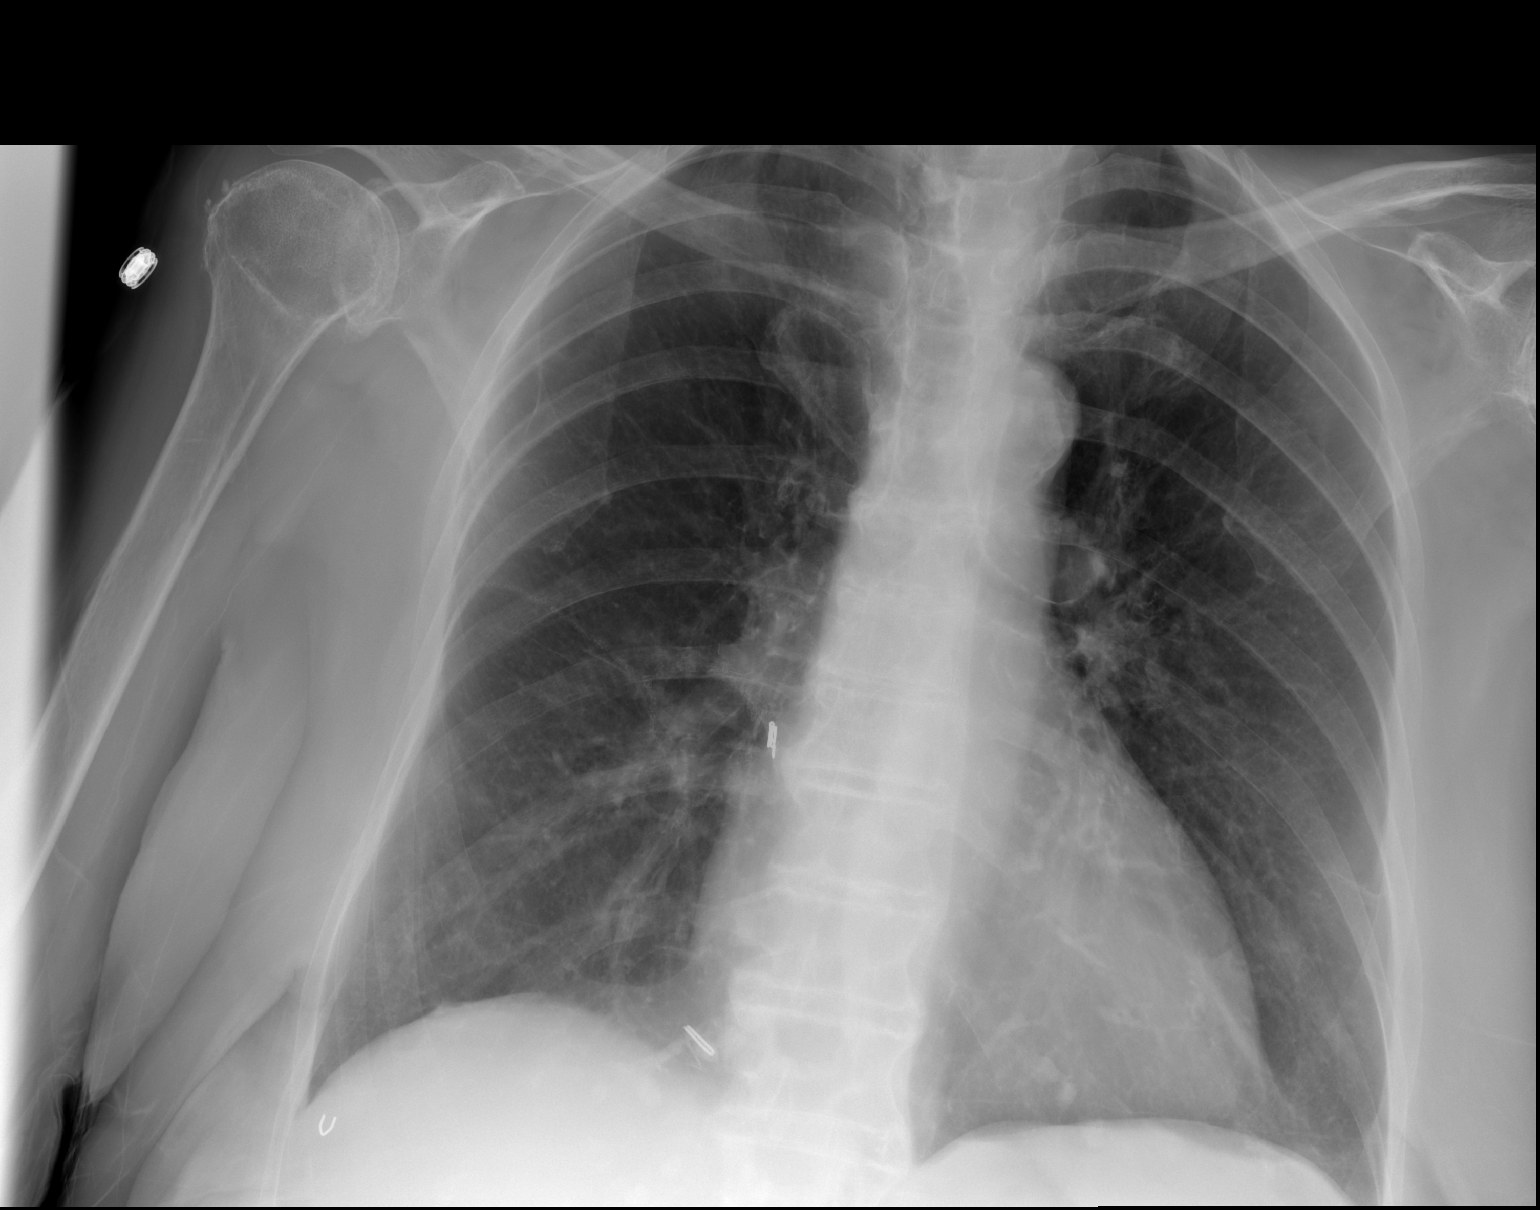

[2 of 2 positions shown; findings below may reference images not displayed]

FINDINGS: Borderline enlarged cardiac silhouette. Normal mediastinal contours.
The lungs appear hyperexpanded. No focal airspace opacities. No
definite pleural effusion, though note, the left costophrenic angle
is excluded from view. No pneumothorax. No evidence of edema.
Surgical clips are seen within the mediastinum and right upper
abdomen. Degenerative change of the right glenohumeral joint.
IMPRESSION: Hyperexpanded lungs without acute cardiopulmonary disease.

## 2015-08-14 IMAGING — CR DG FOOT COMPLETE 3+V*R*
3 series · 3 of 3 positions shown · non-contrast
Comparison: None.

CLINICAL DATA: Fall.  Pain.

EXAM:
RIGHT FOOT COMPLETE - 3+ VIEW

[x foot obl right]
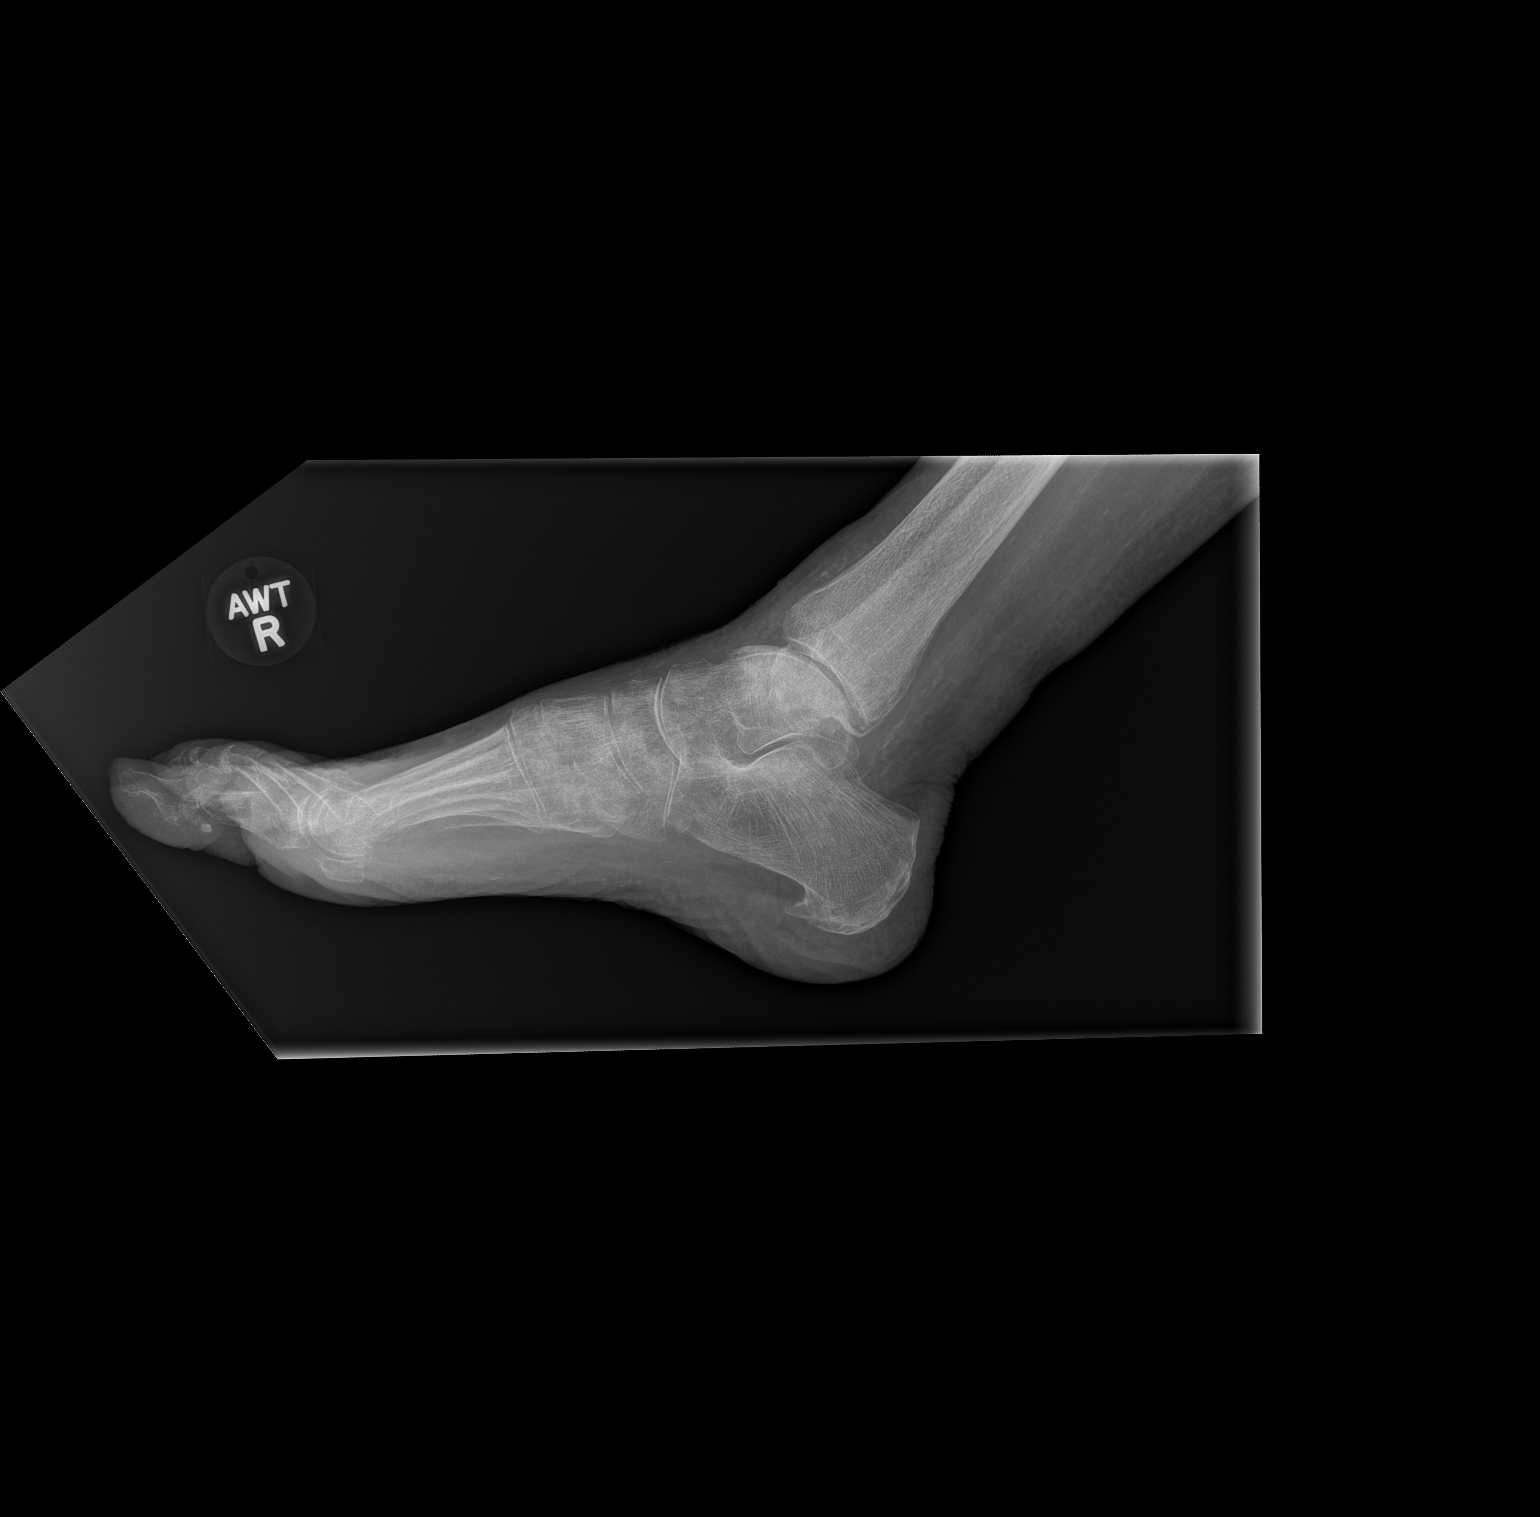

[x foot ap right]
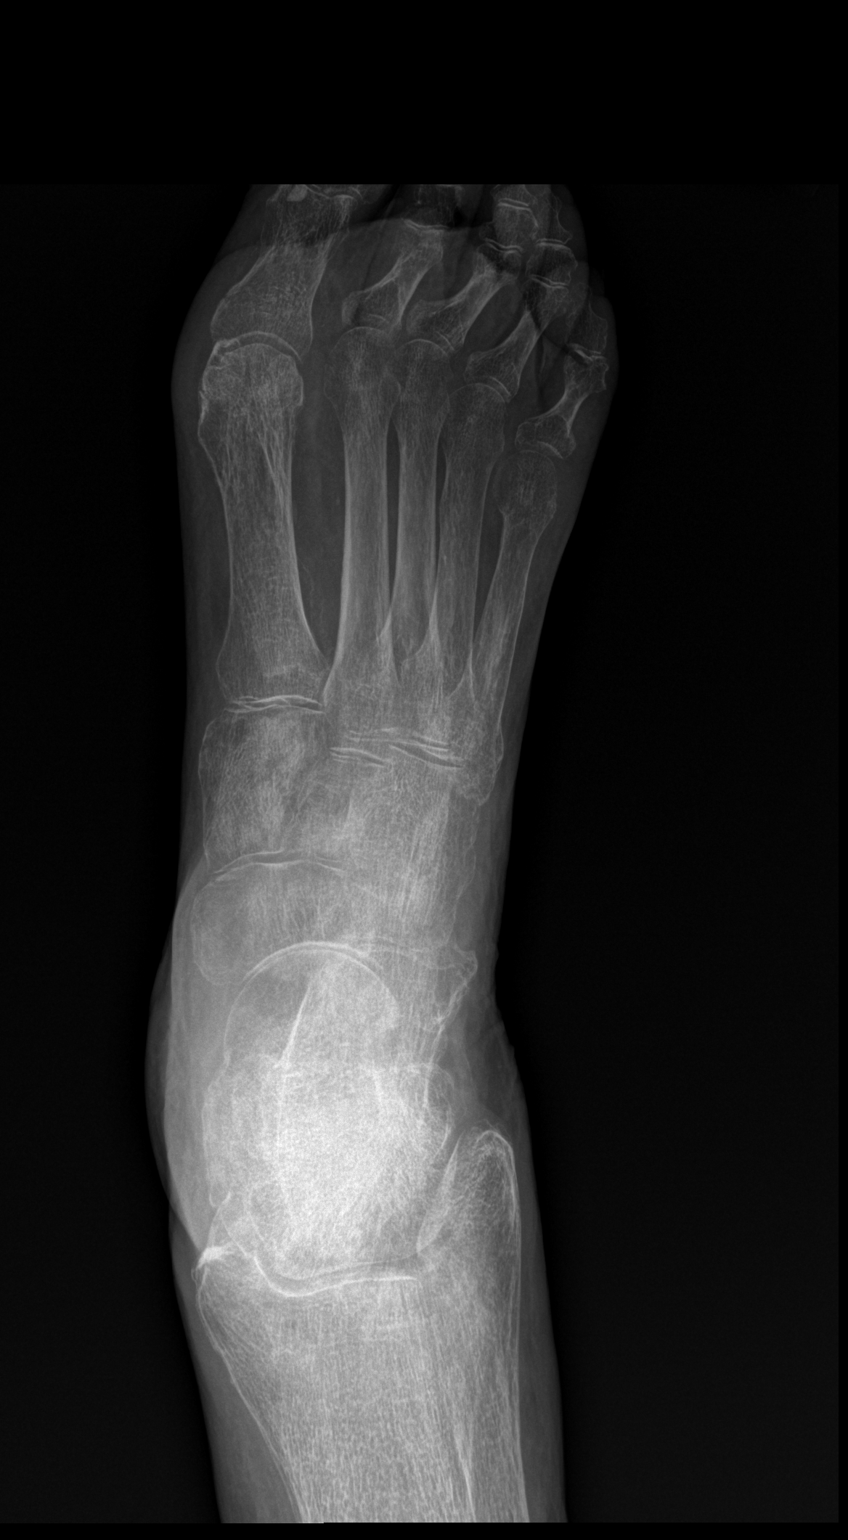

[x foot lat right]
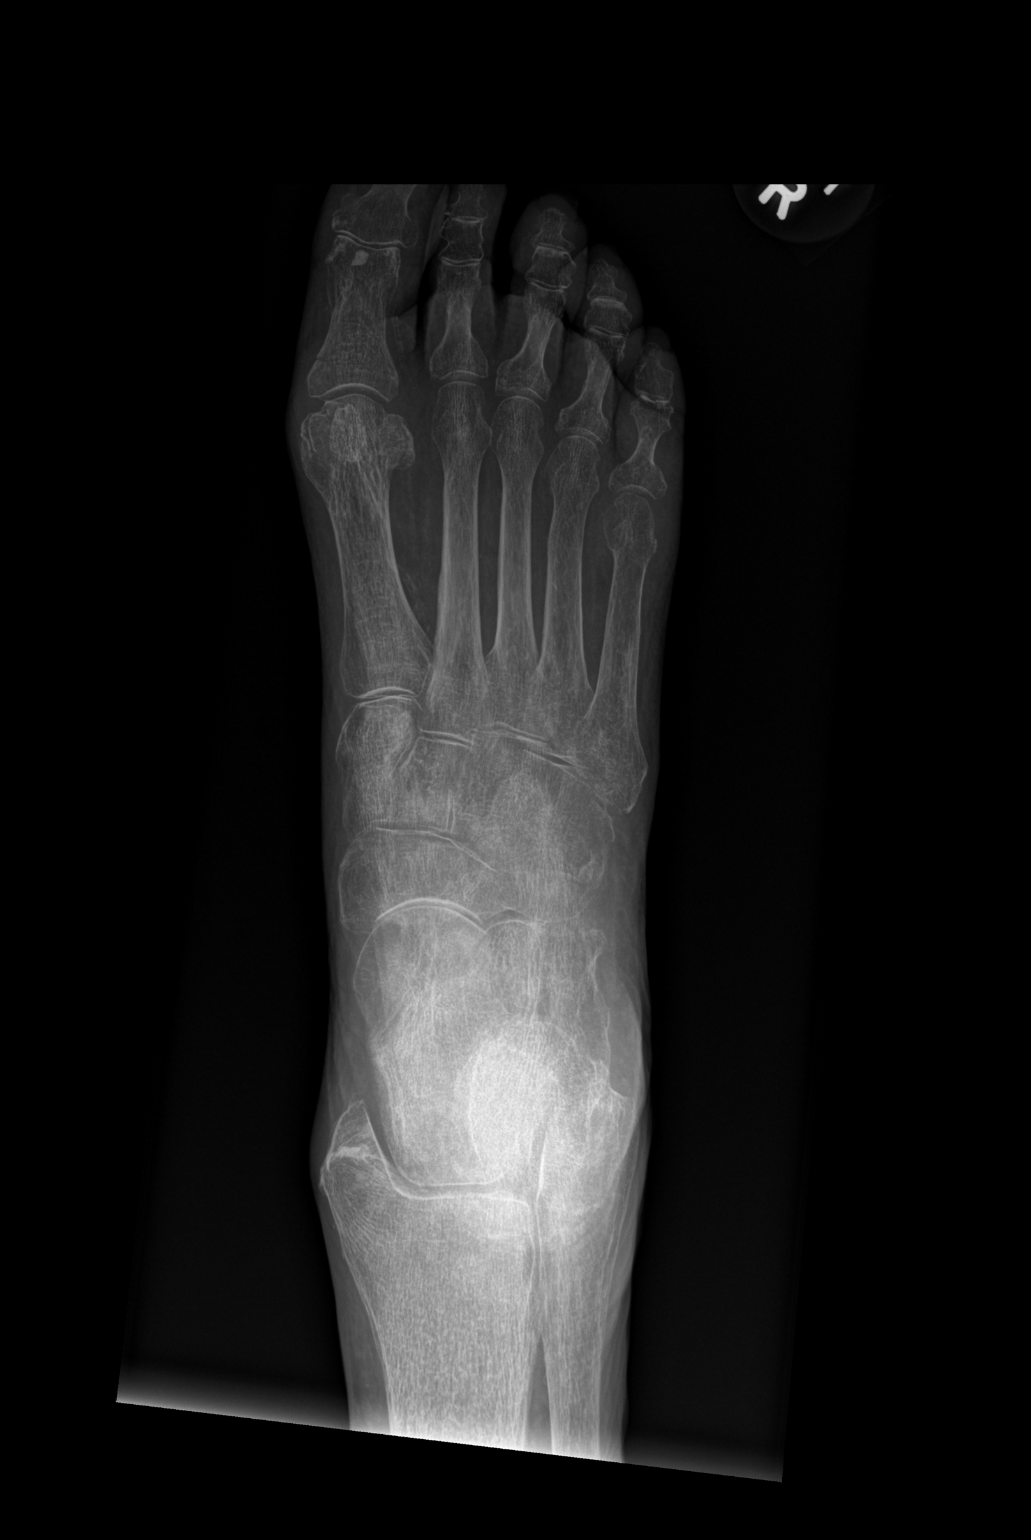

[3 of 3 positions shown; findings below may reference images not displayed]

FINDINGS: Diffuse mild soft tissue swelling is present. Diffuse severe
osteopenia is present. Osteopenia is out most likely with
age-related as opposed to a process such as reflex sympathetic
dystrophy . No evidence of fracture or dislocation. Peripheral
vascular calcification noted consistent with peripheral vascular
disease.
IMPRESSION: 1. No acute bony abnormality.
2. Diffuse soft tissue swelling and severe osteopenia.
3. Peripheral vascular disease.

## 2016-01-29 DEATH — deceased
